# Patient Record
Sex: Female | Born: 1990 | Race: Black or African American | Hispanic: No | Marital: Married | Smoking: Never smoker
Health system: Southern US, Community
[De-identification: ages and names within clinical notes are randomized; demographics above are authoritative.]

## PROBLEM LIST (undated history)

## (undated) ENCOUNTER — Inpatient Hospital Stay (HOSPITAL_COMMUNITY): Payer: Self-pay

## (undated) DIAGNOSIS — Z789 Other specified health status: Secondary | ICD-10-CM

## (undated) DIAGNOSIS — D649 Anemia, unspecified: Secondary | ICD-10-CM

## (undated) HISTORY — PX: NO PAST SURGERIES: SHX2092

---
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
## 2018-07-31 NOTE — L&D Delivery Note (Addendum)
OB/GYN Faculty Practice Delivery Note  Sierra Thomas is a 28 y.o. G1P0 s/p spontaneous vaginal at [redacted]w[redacted]d. She was admitted for Contractions .   GBS Status: Negative (05/11 0000) Maximum Maternal Temperature: Temp (24hrs), Avg:98.2 F (36.8 C), Min:97.8 F (36.6 C), Max:98.6 F (37 C)  Labor Progress: . Admitted in early labor . Started pitocin for slow progress . SROM 8h 34m prior to delivery with mec stained fluid  . Peanut ball placed . Continued pitocin until complete dilation achieved.   Delivery Date/Time: 01/10/2019 at 2137 Delivery: Called to room and patient was complete and pushing. Head delivered OA;LOA. No nuchal cord present. Shoulder and body delivered in usual fashion. Infant with spontaneous cry, placed skin to skin on mother's abdomen, dried and stimulated. Cord clamped x 2 after 1-minute delay, and cut by Dr. Maudie Mercury. Cord blood drawn. Placenta delivered spontaneously with gentle cord traction. Fundus firm with massage and Pitocin. Labia, perineum, vagina, and cervix inspected with 1st degree labial and vaginal with good approximation No repair necessary.   Placenta: spontaneous , intact  Complications: none Lacerations: n/a EBL: 139 mL  Analgesia: IV medication  Postpartum Planning Mom and baby to mother/baby . Lactation consult . AM CBC . Contraception undecided  . No circ  Infant: Viable female  APGAR: 6/8  3226 g  Sierra Thomas, M.D.  01/10/2019 10:01 PM

## 2018-09-23 LAB — OB RESULTS CONSOLE HEPATITIS B SURFACE ANTIGEN: Hepatitis B Surface Ag: NEGATIVE

## 2018-09-23 LAB — OB RESULTS CONSOLE GC/CHLAMYDIA
Chlamydia: NEGATIVE
Gonorrhea: NEGATIVE

## 2018-09-23 LAB — OB RESULTS CONSOLE HIV ANTIBODY (ROUTINE TESTING): HIV: NONREACTIVE

## 2018-09-23 LAB — OB RESULTS CONSOLE RPR: RPR: NONREACTIVE

## 2018-09-23 LAB — OB RESULTS CONSOLE RUBELLA ANTIBODY, IGM: Rubella: IMMUNE

## 2018-10-28 ENCOUNTER — Inpatient Hospital Stay (HOSPITAL_COMMUNITY): Admission: AD | Admit: 2018-10-28 | Payer: Medicaid Other | Source: Home / Self Care

## 2018-12-09 LAB — OB RESULTS CONSOLE GBS: GBS: NEGATIVE

## 2018-12-09 LAB — OB RESULTS CONSOLE GC/CHLAMYDIA
Chlamydia: NEGATIVE
Gonorrhea: NEGATIVE

## 2019-01-10 ENCOUNTER — Encounter (HOSPITAL_COMMUNITY): Payer: Self-pay

## 2019-01-10 ENCOUNTER — Ambulatory Visit (HOSPITAL_COMMUNITY): Payer: Medicaid Other

## 2019-01-10 ENCOUNTER — Other Ambulatory Visit: Payer: Self-pay

## 2019-01-10 ENCOUNTER — Inpatient Hospital Stay (HOSPITAL_COMMUNITY)
Admission: AD | Admit: 2019-01-10 | Discharge: 2019-01-12 | DRG: 807 | Disposition: A | Discharge status: Home / Self Care | Payer: Medicaid Other | Attending: Obstetrics & Gynecology | Admitting: Obstetrics & Gynecology

## 2019-01-10 DIAGNOSIS — Z3A40 40 weeks gestation of pregnancy: Secondary | ICD-10-CM | POA: Diagnosis not present

## 2019-01-10 DIAGNOSIS — Z1159 Encounter for screening for other viral diseases: Secondary | ICD-10-CM

## 2019-01-10 DIAGNOSIS — O480 Post-term pregnancy: Secondary | ICD-10-CM | POA: Diagnosis present

## 2019-01-10 DIAGNOSIS — D649 Anemia, unspecified: Secondary | ICD-10-CM | POA: Diagnosis present

## 2019-01-10 DIAGNOSIS — Z3490 Encounter for supervision of normal pregnancy, unspecified, unspecified trimester: Secondary | ICD-10-CM

## 2019-01-10 DIAGNOSIS — O9902 Anemia complicating childbirth: Secondary | ICD-10-CM | POA: Diagnosis present

## 2019-01-10 HISTORY — DX: Other specified health status: Z78.9

## 2019-01-10 HISTORY — DX: Anemia, unspecified: D64.9

## 2019-01-10 LAB — CBC
HCT: 35.6 % — ABNORMAL LOW (ref 36.0–46.0)
Hemoglobin: 11.8 g/dL — ABNORMAL LOW (ref 12.0–15.0)
MCH: 28.4 pg (ref 26.0–34.0)
MCHC: 33.1 g/dL (ref 30.0–36.0)
MCV: 85.8 fL (ref 80.0–100.0)
Platelets: 220 10*3/uL (ref 150–400)
RBC: 4.15 MIL/uL (ref 3.87–5.11)
RDW: 16.1 % — ABNORMAL HIGH (ref 11.5–15.5)
WBC: 6.9 10*3/uL (ref 4.0–10.5)
nRBC: 0 % (ref 0.0–0.2)

## 2019-01-10 LAB — RPR: RPR Ser Ql: NONREACTIVE

## 2019-01-10 LAB — SARS CORONAVIRUS 2: SARS Coronavirus 2: NOT DETECTED

## 2019-01-10 MED ORDER — ONDANSETRON HCL 4 MG/2ML IJ SOLN: 4.0000 mg | Freq: Four times a day (QID) | INTRAMUSCULAR | Status: DC | PRN

## 2019-01-10 MED ORDER — OXYTOCIN 40 UNITS IN NORMAL SALINE INFUSION - SIMPLE MED
1.0000 m[IU]/min | INTRAVENOUS | Status: DC
  Administered 2019-01-10: 2 m[IU]/min via INTRAVENOUS

## 2019-01-10 MED ORDER — ONDANSETRON HCL 4 MG PO TABS: 4.0000 mg | ORAL_TABLET | ORAL | Status: DC | PRN

## 2019-01-10 MED ORDER — COCONUT OIL OIL: 1.0000 "application " | TOPICAL_OIL | Status: DC | PRN

## 2019-01-10 MED ORDER — ONDANSETRON HCL 4 MG/2ML IJ SOLN: 4.0000 mg | INTRAMUSCULAR | Status: DC | PRN

## 2019-01-10 MED ORDER — TERBUTALINE SULFATE 1 MG/ML IJ SOLN: 0.2500 mg | Freq: Once | INTRAMUSCULAR | Status: DC | PRN

## 2019-01-10 MED ORDER — PRENATAL MULTIVITAMIN CH
1.0000 | ORAL_TABLET | Freq: Every day | ORAL | Status: DC
  Administered 2019-01-11 – 2019-01-12 (×2): 1 via ORAL
  Filled 2019-01-10 (×2): qty 1

## 2019-01-10 MED ORDER — ACETAMINOPHEN 325 MG PO TABS: 650.0000 mg | ORAL_TABLET | ORAL | Status: DC | PRN

## 2019-01-10 MED ORDER — OXYCODONE-ACETAMINOPHEN 5-325 MG PO TABS: 2.0000 | ORAL_TABLET | ORAL | Status: DC | PRN

## 2019-01-10 MED ORDER — ACETAMINOPHEN 325 MG PO TABS
650.0000 mg | ORAL_TABLET | ORAL | Status: DC | PRN
  Administered 2019-01-12: 650 mg via ORAL
  Filled 2019-01-10: qty 2

## 2019-01-10 MED ORDER — DIBUCAINE (PERIANAL) 1 % EX OINT: 1.0000 "application " | TOPICAL_OINTMENT | CUTANEOUS | Status: DC | PRN

## 2019-01-10 MED ORDER — DIPHENHYDRAMINE HCL 25 MG PO CAPS: 25.0000 mg | ORAL_CAPSULE | Freq: Four times a day (QID) | ORAL | Status: DC | PRN

## 2019-01-10 MED ORDER — WITCH HAZEL-GLYCERIN EX PADS: 1.0000 "application " | MEDICATED_PAD | CUTANEOUS | Status: DC | PRN

## 2019-01-10 MED ORDER — FENTANYL CITRATE (PF) 100 MCG/2ML IJ SOLN
50.0000 ug | INTRAMUSCULAR | Status: DC | PRN
  Administered 2019-01-10: 100 ug via INTRAVENOUS
  Filled 2019-01-10: qty 2

## 2019-01-10 MED ORDER — OXYCODONE-ACETAMINOPHEN 5-325 MG PO TABS: 1.0000 | ORAL_TABLET | ORAL | Status: DC | PRN

## 2019-01-10 MED ORDER — OXYTOCIN BOLUS FROM INFUSION
500.0000 mL | Freq: Once | INTRAVENOUS | Status: AC
  Administered 2019-01-10: 22:00:00 500 mL via INTRAVENOUS

## 2019-01-10 MED ORDER — LIDOCAINE HCL (PF) 1 % IJ SOLN: 30.0000 mL | INTRAMUSCULAR | Status: DC | PRN

## 2019-01-10 MED ORDER — IBUPROFEN 600 MG PO TABS
600.0000 mg | ORAL_TABLET | Freq: Four times a day (QID) | ORAL | Status: DC
  Administered 2019-01-10 – 2019-01-12 (×7): 600 mg via ORAL
  Filled 2019-01-10 (×7): qty 1

## 2019-01-10 MED ORDER — MEASLES, MUMPS & RUBELLA VAC IJ SOLR: 0.5000 mL | Freq: Once | INTRAMUSCULAR | Status: DC

## 2019-01-10 MED ORDER — TETANUS-DIPHTH-ACELL PERTUSSIS 5-2.5-18.5 LF-MCG/0.5 IM SUSP: 0.5000 mL | Freq: Once | INTRAMUSCULAR | Status: DC

## 2019-01-10 MED ORDER — SIMETHICONE 80 MG PO CHEW: 80.0000 mg | CHEWABLE_TABLET | ORAL | Status: DC | PRN

## 2019-01-10 MED ORDER — SOD CITRATE-CITRIC ACID 500-334 MG/5ML PO SOLN: 30.0000 mL | ORAL | Status: DC | PRN

## 2019-01-10 MED ORDER — LACTATED RINGERS IV SOLN
500.0000 mL | INTRAVENOUS | Status: DC | PRN
  Administered 2019-01-10: 1000 mL via INTRAVENOUS

## 2019-01-10 MED ORDER — BENZOCAINE-MENTHOL 20-0.5 % EX AERO: 1.0000 "application " | INHALATION_SPRAY | CUTANEOUS | Status: DC | PRN

## 2019-01-10 MED ORDER — OXYTOCIN 40 UNITS IN NORMAL SALINE INFUSION - SIMPLE MED
2.5000 [IU]/h | INTRAVENOUS | Status: DC
  Administered 2019-01-10: 2.5 [IU]/h via INTRAVENOUS
  Filled 2019-01-10: qty 1000

## 2019-01-10 MED ORDER — DOCUSATE SODIUM 100 MG PO CAPS
100.0000 mg | ORAL_CAPSULE | Freq: Two times a day (BID) | ORAL | Status: DC
  Administered 2019-01-10 – 2019-01-12 (×4): 100 mg via ORAL
  Filled 2019-01-10 (×4): qty 1

## 2019-01-10 NOTE — Discharge Summary (Signed)
OB Discharge Summary     Patient Name: Sierra Thomas DOB: 12/17/1990 MRN: [id number]  Date of admission: 01/10/2019 Delivering MD: Genia HotterKIM, RACHEL E   Date of discharge: 01/12/2019  Admitting diagnosis: 43 WKS, ABD AND BACK PAIN Intrauterine pregnancy: 4068w4d     Secondary diagnosis:  Principal Problem:   Intrauterine pregnancy Active Problems:   Anemia  Additional problems: positive antibody screen- S antigen with nonspecific cold antibody     Discharge diagnosis: Term Pregnancy Delivered                                                                                                Post partum procedures:none  Augmentation: Pitocin  Complications: None  Hospital course:  Induction of Labor With Vaginal Delivery   28 y.o. yo G1P0 at 3868w4d was admitted to the hospital 01/10/2019 for induction of labor.  Indication for induction: Postdates.  She was admitted and thought to be in latent labor, however she didn't have cx change and was started on Pitocin for IOL due to 40.4wk status. Her antibody screen was positive on admission labs and was resulted as S antigen w/ nonspecific cold antibody .Patient went on to have an uncomplicated labor course as follows: Membrane Rupture Time/Date: 1:22 PM ,01/10/2019   Intrapartum Procedures: Episiotomy: None [1]                                         Lacerations:  None [1]  Patient had delivery of a Viable infant.  Information for the patient's newborn:  Ardith Darkyiramahirwe, Boy Nazirah [[id number]][[id number]]  Delivery Method: Vag-Spont    01/10/2019  Details of delivery can be found in separate delivery note.  Patient had a routine postpartum course. Patient is discharged home 01/12/19.  Physical exam  Vitals:   01/11/19 0840 01/11/19 1100 01/11/19 2213 01/12/19 0528  BP: (!) 110/57 114/62 103/67 (!) 97/58  Pulse: 71 74 63 80  Resp: 18 18 14 18   Temp: 98.2 F (36.8 C) 98.1 F (36.7 C) 97.9 F (36.6 C) 99.6 F (37.6 C)  TempSrc: Oral Oral Oral  Oral  SpO2: 100% 100% 100% 100%  Weight:      Height:       General: alert, cooperative and no distress Lochia: appropriate Uterine Fundus: firm Incision: N/A DVT Evaluation: No evidence of DVT seen on physical exam. No cords or calf tenderness. No significant calf/ankle edema. Labs: Lab Results  Component Value Date   WBC 9.6 01/11/2019   HGB 10.7 (L) 01/11/2019   HCT 32.7 (L) 01/11/2019   MCV 86.1 01/11/2019   PLT 228 01/11/2019   No flowsheet data found.  Discharge instruction: per After Visit Summary and "Baby and Me Booklet".  After visit meds:  Allergies as of 01/12/2019   No Known Allergies     Medication List    TAKE these medications   ibuprofen 600 MG tablet Commonly known as: ADVIL Take 1 tablet (600 mg total) by mouth every 6 (six) hours.   multivitamin-prenatal  27-0.8 MG Tabs tablet Take 1 tablet by mouth daily at 12 noon.       Diet: routine diet  Activity: Advance as tolerated. Pelvic rest for 6 weeks.   Outpatient follow up:4 weeks Follow up Appt:No future appointments. Follow up Visit:No follow-ups on file.  Postpartum contraception: unsure at time of discharge. Info given. Pelvic rest advised until PP visit and initiation of birth control   Newborn Data: Live born female  Birth Weight: 3226gm (7lb 1.8oz) APGAR: 6, 8  Newborn Delivery   Birth date/time: 01/10/2019 21:37:00 Delivery type: Vaginal, Spontaneous      Baby Feeding: Breast Disposition:home with mother   01/12/2019 Kerry Hough, PA-C

## 2019-01-10 NOTE — Progress Notes (Signed)
OB/GYN Faculty Practice: Labor Progress Note  Subjective: Uncomfortable during contractions, lots of back labor.   Objective: BP (!) 137/57   Pulse 63   Temp 98.4 F (36.9 C) (Oral)   Resp 18   Ht 5\' 1"  (1.549 m)   Wt 73.9 kg   BMI 30.80 kg/m  Gen: uncomfortable appearing.  Dilation: 7 Effacement (%): 100 Cervical Position: Anterior Station: 0 Presentation: Vertex Exam by:: Carter Kitten RNC  Assessment and Plan: 28 y.o. G1P0 [redacted]w[redacted]d admitted in early labor.   Labor: Now in active labor. Per RN exam, more OP presentation at this time. Going to try to peanut ball.  -- pain control: desires unmedicated  -- PPH Risk: low  Fetal Well-Being: EFW 7lbs by Leopolds. Cephalic by sutures.  -- Category I - continuous fetal monitoring - excellent variability, occasional variables but improves with repositioning  -- GBS negative    Ronald Vinsant S. Juleen China, DO OB/GYN Fellow, Faculty Practice  5:35 PM

## 2019-01-10 NOTE — Progress Notes (Addendum)
Patient decided on IV pain medication.  Multiple conversations on education of use of pain medications given over the shift.  Patient has been reluctant to take due to believing that her "baby will have birth defects" from administration.  Both I and Dr Juleen China have counseled her on the way pain medication works.  Answered multiple questions for patient and FOB.

## 2019-01-10 NOTE — H&P (Addendum)
OBSTETRIC ADMISSION HISTORY AND PHYSICAL  Subective Sierra Thomas is a 28 y.o. female G1P0 with IUP at [redacted]w[redacted]d by LMP admitted for active labor.  Reports fetal movement. Denies vaginal bleeding and ROM. She received her prenatal care at [facility].  Support person in labor: unknown  Ultrasounds . 25w3 (09/23/18) - nl . 29w6 (10/21/18) . 36w2 (12/09/18) EFW [id number]   Prenatal History/Complications: . Late [facility]  Past Medical History:  Diagnosis Date  . Anemia   . Medical history non-contributory    Past Surgical History:  Procedure Laterality Date  . NO PAST SURGERIES     OB History    Gravida  1   Para      Term      Preterm      AB      Living        SAB      TAB      Ectopic      Multiple      Live Births             Social History   Socioeconomic History  . Marital status: Married    Spouse name: Not on file  . Number of children: Not on file  . Years of education: Not on file  . Highest education level: Not on file  Occupational History  . Not on file  Social Needs  . Financial resource strain: Not on file  . Food insecurity    Worry: Not on file    Inability: Not on file  . Transportation needs    Medical: Not on file    Non-medical: Not on file  Tobacco Use  . Smoking status: Never Smoker  . Smokeless tobacco: Never Used  Substance and Sexual Activity  . Alcohol use: Never    Frequency: Never  . Drug use: Never  . Sexual activity: Yes    Partners: Male  Lifestyle  . Physical activity    Days per week: Not on file    Minutes per session: Not on file  . Stress: Not on file  Relationships  . Social Herbalist on phone: Not on file    Gets together: Not on file    Attends religious service: Not on file    Active member of club or organization: Not on file    Attends meetings of clubs or organizations: Not on file    Relationship status: Not on file  Other Topics Concern  . Not on file  Social History Narrative   Works  at [employer]. Education: high Printmaker.   Family History: History reviewed. No pertinent family history. Allergies: No Known Allergies Medications:  Current Meds  Medication Sig  . Prenatal Vit-Fe Fumarate-FA (MULTIVITAMIN-PRENATAL) 27-0.8 MG TABS tablet Take 1 tablet by mouth daily at 12 noon.   Review of Systems  All systems reviewed and negative except as stated in HPI  Objective Physical Exam:  Blood pressure 113/77, pulse 75, temperature 98.6 F (37 C), temperature source Oral, resp. rate 15, height 5\' 1"  (1.549 m), weight 73.9 kg. General appearance: alert, appears stated age and mild distress w/ contractions  Lungs: no respiratory distress Heart: RRR. No BLEE.  Abdomen: soft, non-tender; gravid  Pelvic: deferred  Extremities: Moving spontaneously, warm, well perfused. 2+ DP. Uterine activity: contractions q 2-63m Presentation: cephalic Fetal monitoring: baseline 125 / mod variability/ +a / -d  Prenatal labs: ABO, Rh:  O pos Antibody:  Negative Rubella:  immune RPR:  NR HBsAg:   negative HIV:   NR GBS:   negative (12/09/18) Glucola: 112 91 90 Genetic screening:  Too late   Prenatal Transfer Tool  Maternal Diabetes: No Genetic Screening: Declined Maternal Ultrasounds/Referrals: Normal Fetal Ultrasounds or other Referrals:  None Maternal Substance Abuse:  No Significant Maternal Medications:  None Significant Maternal Lab Results: None  Results for orders placed or performed during the hospital encounter of 01/10/19 (from the past 24 hour(s))  CBC   Collection Time: 01/10/19  6:47 AM  Result Value Ref Range   WBC 6.9 4.0 - 10.5 K/uL   RBC 4.15 3.87 - 5.11 MIL/uL   Hemoglobin 11.8 (L) 12.0 - 15.0 g/dL   HCT 40.935.6 (L) 81.136.0 - 91.446.0 %   MCV 85.8 80.0 - 100.0 fL   MCH 28.4 26.0 - 34.0 pg   MCHC 33.1 30.0 - 36.0 g/dL   RDW 78.216.1 (H) 95.611.5 - 21.315.5 %   Platelets 220 150 - 400 K/uL   nRBC 0.0 0.0 - 0.2 %    Patient Active Problem List   Diagnosis Date  Noted  . Intrauterine pregnancy 01/10/2019  . Anemia 01/10/2019    Assessment & Plan:  Sierra Thomas is a 28 y.o. G1P0 at 9434w4d - Spontaneous labor, progressing normally  Labor: Progressing normally  . Pain control: IV pain meds and declines epidural . Anticipated MOD: NSVD . PPH risk: low  Fetal Wellbeing: EFW 2906g at 36w2 U/S. Cephalic by CE.  Category I tracing. . GBS: Negative . Continuous fetal monitoring  Postpartum Planning . boy / breast / undecided  . RI, [x]  TDAP   Genia Hotterachel Kim, M.D.  Family Medicine  PGY-1 01/10/2019 7:46 AM   Attestation: I have seen this patient and agree with the resident's documentation. I have examined them separately, and we have discussed the plan of care.  Cristal DeerLaurel S. Earlene PlaterWallace, DO OB/GYN Fellow

## 2019-01-10 NOTE — Progress Notes (Signed)
OB/GYN Faculty Practice: Labor Progress Note  Subjective: Much more uncomfortable with contractions.   Objective: BP 139/75   Pulse 67   Temp 98.4 F (36.9 C) (Oral)   Resp 15   Ht 5\' 1"  (1.549 m)   Wt 73.9 kg   BMI 30.80 kg/m  Gen: uncomfortable appearing.  Dilation: 4 Effacement (%): 80 Cervical Position: Anterior Station: -2 Presentation: Vertex Exam by:: Dr. Juleen China   Assessment and Plan: 28 y.o. G1P0 [redacted]w[redacted]d admitted in early labor.   Labor: Contractions now back to back, will decrease pitocin to 6. SROM meconium-stained fluid with SVE. Cervix thinning out with minimal change in dilation at this time.  -- pain control: desires unmedicated  -- PPH Risk: low  Fetal Well-Being: EFW 7lbs by Leopolds. Cephalic by sutures.  -- Category I - continuous fetal monitoring - in last couple of hours has had occasional variables but seem to resolve on own or with repositioning -- GBS negative    Charisse Wendell S. Juleen China, DO OB/GYN Fellow, Faculty Practice  1:26 PM

## 2019-01-10 NOTE — Progress Notes (Signed)
DR Maudie Mercury notified of pt's admission and status. Aware of ctx pattern, sve, reactive FHR tracing, late to care. Will admit to [facility]

## 2019-01-10 NOTE — Progress Notes (Signed)
OB/GYN Faculty Practice: Labor Progress Note  *Late entry because of patient care responsibilities. Saw patient around 0930 just prior to pitocin being started* Subjective: Doing well, contractions have spaced out. Somewhat uncomfortable appearing.   Objective: BP (!) 109/59   Pulse 70   Temp 98.6 F (37 C) (Oral)   Resp 16   Ht 5\' 1"  (1.549 m)   Wt 73.9 kg   BMI 30.80 kg/m  Gen: mildly uncomfortable appearing during contractons Dilation: 3.5 Effacement (%): 50 Cervical Position: Anterior Station: -2 Presentation: Vertex Exam by:: Dr Juleen China  Assessment and Plan: 28 y.o. G1P0 [redacted]w[redacted]d here for early/latent labor.   Labor: On check, patient is essentially unchanged (reported to have been 4-5cm dilated) and contractions have spaced out since IVF started. Discussed options including augmenting early labor with pitocin since post-dates, expectant management. Patient agreeable to pitocin. Does have BBOW so once head better engaged will consider AROM.  -- pain control: desires unmedicated  -- PPH Risk: low  Fetal Well-Being: EFW 7lbs by Leopolds. Cephalic by sutures.  -- Category I - continuous fetal monitoring  -- GBS negative    Alvira Hecht S. Juleen China, DO OB/GYN Fellow, Faculty Practice  11:45 AM

## 2019-01-10 NOTE — MAU Note (Signed)
Contractions since 1200 Thurs after clinic appt. Was 3cm then. Having some bloody show.

## 2019-01-10 NOTE — Progress Notes (Signed)
Patient ID: Sierra Thomas, female   DOB: 26-Jun-1991, 28 y.o.   MRN: [id number]  In to room to introduce myself; pt rec'd Fentanyl @ 1830 and was 8/100/0 per RN exam at that time. FHR 120-130s, +accels, occ variables. Ctx reg with Pit at 74mu/min. Support offered.  Myrtis Ser [facility] 01/10/2019 6:51 PM

## 2019-01-11 LAB — CBC
HCT: 32.7 % — ABNORMAL LOW (ref 36.0–46.0)
Hemoglobin: 10.7 g/dL — ABNORMAL LOW (ref 12.0–15.0)
MCH: 28.2 pg (ref 26.0–34.0)
MCHC: 32.7 g/dL (ref 30.0–36.0)
MCV: 86.1 fL (ref 80.0–100.0)
Platelets: 228 10*3/uL (ref 150–400)
RBC: 3.8 MIL/uL — ABNORMAL LOW (ref 3.87–5.11)
RDW: 16.3 % — ABNORMAL HIGH (ref 11.5–15.5)
WBC: 9.6 10*3/uL (ref 4.0–10.5)
nRBC: 0 % (ref 0.0–0.2)

## 2019-01-11 NOTE — Lactation Note (Signed)
This note was copied from a baby's chart. Lactation Consultation Note  Patient Name: Sierra Thomas MCNOB'S Date: 01/11/2019   P1, Baby 17 hours, sleepy and spitty. Reviewed hand expression and spoon fed baby drops. Attempted latching in football hold but baby did not sustain latch.  Left baby STS on mother's chest. Feed on demand approximately 8-12 times per day.   Reviewed basics and provided lactation brochure.      Maternal Data    Feeding    LATCH Score                   Interventions    Lactation Tools Discussed/Used     Consult Status      Vivianne Master [facility] 01/11/2019, 3:02 PM

## 2019-01-11 NOTE — Progress Notes (Addendum)
Post Partum Day 1  Subjective: Up ad lib, voiding, tolerating PO, and + flatus. Denies dizziness, lightheadedness, tachycardia. Appropriate Lochia. Pain well controlled. Reports mild headache this morning, but not worsening.  Objective: BP (!) 106/57 (BP Location: Right Arm)   Pulse 78   Temp 98.9 F (37.2 C) (Oral)   Resp 20   Ht 5\' 1"  (1.549 m)   Wt 73.9 kg   SpO2 99%   Breastfeeding Unknown   BMI 30.80 kg/m  Intake/Output      06/12 0701 - 06/13 0700 06/13 0701 - 06/14 0700   Blood 139    Total Output 139    Net -139           Physical Exam:  General: alert, cooperative, appears stated age, fatigued, and no distress Lungs: CTAB, no crackles or wheezes Heart: RRR, no m/r/r.  Lochia: appropriate Uterine Fundus: firm DVT Evaluation: No evidence of DVT seen on physical exam, extremities warm and well perfused.   Recent Labs    01/10/19 0647 01/11/19 0451  HGB 11.8* 10.7*  HCT 35.6* 32.7*   Assessment/Plan: Postpartum Day # 1 . Plan for d/c tomorrow  . Continue daily lactation  . Contraception: undecided.  . Circumcision: no    LOS: 1 day   Wilber Oliphant 01/11/2019, 7:03 AM

## 2019-01-12 MED ORDER — IBUPROFEN 600 MG PO TABS: 600.0000 mg | ORAL_TABLET | Freq: Four times a day (QID) | ORAL | 0 refills | Status: DC

## 2019-01-12 NOTE — Discharge Instructions (Signed)
Vaginal Delivery, Care After °Refer to this sheet in the next few weeks. These instructions provide you with information about caring for yourself after vaginal delivery. Your health care provider may also give you more specific instructions. Your treatment has been planned according to current medical practices, but problems sometimes occur. Call your health care provider if you have any problems or questions. °What can I expect after the procedure? °After vaginal delivery, it is common to have: °· Some bleeding from your vagina. °· Soreness in your abdomen, your vagina, and the area of skin between your vaginal opening and your anus (perineum). °· Pelvic cramps. °· Fatigue. °Follow these instructions at home: °Medicines °· Take over-the-counter and prescription medicines only as told by your health care provider. °· If you were prescribed an antibiotic medicine, take it as told by your health care provider. Do not stop taking the antibiotic until it is finished. °Driving ° °· Do not drive or operate heavy machinery while taking prescription pain medicine. °· Do not drive for 24 hours if you received a sedative. °Lifestyle °· Do not drink alcohol. This is especially important if you are breastfeeding or taking medicine to relieve pain. °· Do not use tobacco products, including cigarettes, chewing tobacco, or e-cigarettes. If you need help quitting, ask your health care provider. °Eating and drinking °· Drink at least 8 eight-ounce glasses of water every day unless you are told not to by your health care provider. If you choose to breastfeed your baby, you may need to drink more water than this. °· Eat high-fiber foods every day. These foods may help prevent or relieve constipation. High-fiber foods include: °? Whole grain cereals and breads. °? Brown rice. °? Beans. °? Fresh fruits and vegetables. °Activity °· Return to your normal activities as told by your health care provider. Ask your health care provider what  activities are safe for you. °· Rest as much as possible. Try to rest or take a nap when your baby is sleeping. °· Do not lift anything that is heavier than your baby or 10 lb (4.5 kg) until your health care provider says that it is safe. °· Talk with your health care provider about when you can engage in sexual activity. This may depend on your: °? Risk of infection. °? Rate of healing. °? Comfort and desire to engage in sexual activity. °Vaginal Care °· If you have an episiotomy or a vaginal tear, check the area every day for signs of infection. Check for: °? More redness, swelling, or pain. °? More fluid or blood. °? Warmth. °? Pus or a bad smell. °· Do not use tampons or douches until your health care provider says this is safe. °· Watch for any blood clots that may pass from your vagina. These may look like clumps of dark red, brown, or black discharge. °General instructions °· Keep your perineum clean and dry as told by your health care provider. °· Wear loose, comfortable clothing. °· Wipe from front to back when you use the toilet. °· Ask your health care provider if you can shower or take a bath. If you had an episiotomy or a perineal tear during labor and delivery, your health care provider may tell you not to take baths for a certain length of time. °· Wear a bra that supports your breasts and fits you well. °· If possible, have someone help you with household activities and help care for your baby for at least a few days after you   leave the hospital.  Keep all follow-up visits for you and your baby as told by your health care provider. This is important. Contact a health care provider if:  You have: ? Vaginal discharge that has a bad smell. ? Difficulty urinating. ? Pain when urinating. ? A sudden increase or decrease in the frequency of your bowel movements. ? More redness, swelling, or pain around your episiotomy or vaginal tear. ? More fluid or blood coming from your episiotomy or vaginal  tear. ? Pus or a bad smell coming from your episiotomy or vaginal tear. ? A fever. ? A rash. ? Little or no interest in activities you used to enjoy. ? Questions about caring for yourself or your baby.  Your episiotomy or vaginal tear feels warm to the touch.  Your episiotomy or vaginal tear is separating or does not appear to be healing.  Your breasts are painful, hard, or turn red.  You feel unusually sad or worried.  You feel nauseous or you vomit.  You pass large blood clots from your vagina. If you pass a blood clot from your vagina, save it to show to your health care provider. Do not flush blood clots down the toilet without having your health care provider look at them.  You urinate more than usual.  You are dizzy or light-headed.  You have not breastfed at all and you have not had a menstrual period for 12 weeks after delivery.  You have stopped breastfeeding and you have not had a menstrual period for 12 weeks after you stopped breastfeeding. Get help right away if:  You have: ? Pain that does not go away or does not get better with medicine. ? Chest pain. ? Difficulty breathing. ? Blurred vision or spots in your vision. ? Thoughts about hurting yourself or your baby.  You develop pain in your abdomen or in one of your legs.  You develop a severe headache.  You faint.  You bleed from your vagina so much that you fill two sanitary pads in one hour. This information is not intended to replace advice given to you by your health care provider. Make sure you discuss any questions you have with your health care provider. Document Released: 07/14/2000 Document Revised: 12/29/2015 Document Reviewed: 08/01/2015 Elsevier Interactive Patient Education  2019 [employer].  Contraception Choices Contraception, also called birth control, means things to use or ways to try not to get pregnant. Hormonal birth control This kind of birth control uses hormones. Here are some  types of hormonal birth control:  A tube that is put under skin of the arm (implant). The tube can stay in for as long as 3 years.  Shots to get every 3 months (injections).  Pills to take every day (birth control pills).  A patch to change 1 time each week for 3 weeks (birth control patch). After that, the patch is taken off for 1 week.  A ring to put in the vagina. The ring is left in for 3 weeks. Then it is taken out of the vagina for 1 week. Then a new ring is put in.  Pills to take after unprotected sex (emergency birth control pills). Barrier birth control Here are some types of barrier birth control:  A thin covering that is put on the penis before sex (female condom). The covering is thrown away after sex.  A soft, loose covering that is put in the vagina before sex (female condom). The covering is thrown away after  sex.  A rubber bowl that sits over the cervix (diaphragm). The bowl must be made for you. The bowl is put into the vagina before sex. The bowl is left in for 6-8 hours after sex. It is taken out within 24 hours.  A small, soft cup that fits over the cervix (cervical cap). The cup must be made for you. The cup can be left in for 6-8 hours after sex. It is taken out within 48 hours.  A sponge that is put into the vagina before sex. It must be left in for at least 6 hours after sex. It must be taken out within 30 hours. Then it is thrown away.  A chemical that kills or stops sperm from getting into the uterus (spermicide). It may be a pill, cream, jelly, or foam to put in the vagina. The chemical should be used at least 10-15 minutes before sex. IUD (intrauterine) birth control An IUD is a small, T-shaped piece of plastic. It is put inside the uterus. There are two kinds:  Hormone IUD. This kind can stay in for 3-5 years.  Copper IUD. This kind can stay in for 10 years. Permanent birth control Here are some types of permanent birth control:  Surgery to block the  fallopian tubes.  Having an insert put into each fallopian tube.  Surgery to tie off the tubes that carry sperm (vasectomy). Natural planning birth control Here are some types of natural planning birth control:  Not having sex on the days the woman could get pregnant.  Using a calendar: ? To keep track of the length of each period. ? To find out what days pregnancy can happen. ? To plan to not have sex on days when pregnancy can happen.  Watching for symptoms of ovulation and not having sex during ovulation. One way the woman can check for ovulation is to check her temperature.  Waiting to have sex until after ovulation. Summary  Contraception, also called birth control, means things to use or ways to try not to get pregnant.  Hormonal methods of birth control include implants, injections, pills, patches, vaginal rings, and emergency birth control pills.  Barrier methods of birth control can include female condoms, female condoms, diaphragms, cervical caps, sponges, and spermicides.  There are two types of IUD (intrauterine device) birth control. An IUD can be put in a woman's uterus to prevent pregnancy for 3-5 years.  Permanent sterilization can be done through a procedure for males, females, or both.  Natural planning methods involve not having sex on the days when the woman could get pregnant. This information is not intended to replace advice given to you by your health care provider. Make sure you discuss any questions you have with your health care provider. Document Released: 05/14/2009 Document Revised: 02/21/2018 Document Reviewed: 07/27/2016 Elsevier Interactive Patient Education  2019 [employer]

## 2019-01-12 NOTE — Lactation Note (Signed)
This note was copied from a baby's chart. Lactation Consultation Note  Patient Name: Sierra Thomas [id number] Date: 01/12/2019 Reason for consult: Follow-up assessment;Term;Infant weight loss([place] interpreter - # [phone] - Tammi Klippel) [name] is 4 hours old  Teachey reviewed and updated the doc flow sheets with mom  / and per interpreter.  Per mom breast feeding and latching are going well and no soreness.  Sore nipple and engorgement prevention and tx reviewed.  LC reviewed the use of hand pump, storage of breast milk, importance of STS feedings Until the baby is back to birth weight and gaining steadily, and can stay awake for majority of feeding.  Discussed nutritive feeding patterns compared to non - nutritive , and the importance of watching the  Baby for hanging out latched.  LC recommended to mom to keep feeding diary for at least 2 weeks.  Mom mentioned she has an [place] speaking friend that can call with her breast feeding questions.  Mom as the phone number and website.  Per mom - her breast feeding questions were answered and she was satisfied.   Maternal Data Has patient been taught Hand Expression?: Yes  Feeding Feeding Type: (just finished breast feeding - deep latch noted)  LATCH Score                   Interventions Interventions: Breast feeding basics reviewed  Lactation Tools Discussed/Used Tools: Pump;Flanges Flange Size: 24 Breast pump type: Manual Pump Review: Setup, frequency, and cleaning;Milk Storage Initiated by:: MAI Date initiated:: 01/12/19   Consult Status Consult Status: Complete Date: 01/12/19    [name] 01/12/2019, 12:05 PM

## 2019-01-13 ENCOUNTER — Inpatient Hospital Stay (HOSPITAL_COMMUNITY): Payer: Medicaid Other

## 2019-01-21 LAB — TYPE AND SCREEN
ABO/RH(D): O POS
Antibody Identification: POSITIVE
Antibody Screen: POSITIVE
Donor AG Type: NEGATIVE
Donor AG Type: NEGATIVE
Unit division: 0
Unit division: 0

## 2019-01-21 LAB — BPAM RBC
Blood Product Expiration Date: 202007172359
Blood Product Expiration Date: 202007172359
ISSUE DATE / TIME: 202006211820
ISSUE DATE / TIME: 202006220015
Unit Type and Rh: 5100
Unit Type and Rh: 5100

## 2019-11-11 ENCOUNTER — Other Ambulatory Visit: Payer: Self-pay

## 2019-11-11 ENCOUNTER — Encounter (HOSPITAL_COMMUNITY): Payer: Self-pay

## 2019-11-11 ENCOUNTER — Ambulatory Visit (HOSPITAL_COMMUNITY)
Admission: EM | Admit: 2019-11-11 | Discharge: 2019-11-11 | Disposition: A | Discharge status: Home / Self Care | Payer: Medicaid Other | Attending: Family Medicine | Admitting: Family Medicine

## 2019-11-11 DIAGNOSIS — M545 Low back pain, unspecified: Secondary | ICD-10-CM

## 2019-11-11 DIAGNOSIS — M546 Pain in thoracic spine: Secondary | ICD-10-CM

## 2019-11-11 MED ORDER — IBUPROFEN 800 MG PO TABS: 800.0000 mg | ORAL_TABLET | Freq: Three times a day (TID) | ORAL | 0 refills | Status: DC

## 2019-11-11 NOTE — Discharge Instructions (Signed)
Use anti-inflammatories for pain/swelling. You may take up to 800 mg Ibuprofen every 8 hours with food. You may supplement Ibuprofen with Tylenol [phone] mg every 8 hours.  Gentle stretching- see attached Alternate ice and heat Avoid heavy lifting, use legs to lift/bend down Follow up if not improving or worsening

## 2019-11-11 NOTE — ED Triage Notes (Signed)
Pt c/o lower back pain for approx 1 week that increases with turning; denies injury/trauma to area. Denies abdom pain, or dysuria sx.

## 2019-11-12 NOTE — ED Provider Notes (Signed)
[facility]    CSN: [id number] Arrival date & time: 11/11/19  1659      History   Chief Complaint Chief Complaint  Patient presents with  . Back Pain    HPI Amber Williard is a 29 y.o. female presenting today for evaluation of back pain.  Patient notes that she has had mid and lower back pain for approximately 1 week.  She denies any injury trauma or fall.  She denies any increase in activity or heavy lifting.  Pain worsens with twisting.  Denies abdominal pain nausea or vomiting.  Denies urinary symptoms of dysuria, increased frequency or urgency.  Patient does have a 92-month-old.  Is breast-feeding.  HPI  Past Medical History:  Diagnosis Date  . Anemia   . Medical history non-contributory     Patient Active Problem List   Diagnosis Date Noted  . Intrauterine pregnancy 01/10/2019  . Anemia 01/10/2019    Past Surgical History:  Procedure Laterality Date  . NO PAST SURGERIES      OB History    Gravida  1   Para  1   Term  1   Preterm      AB      Living  1     SAB      TAB      Ectopic      Multiple  0   Live Births  1            Home Medications    Prior to Admission medications   Medication Sig Start Date End Date Taking? Authorizing Provider  ibuprofen (ADVIL) 800 MG tablet Take 1 tablet (800 mg total) by mouth 3 (three) times daily. 11/11/19   Takiera Mayo C, PA-C  Prenatal Vit-Fe Fumarate-FA (MULTIVITAMIN-PRENATAL) 27-0.8 MG TABS tablet Take 1 tablet by mouth daily at 12 noon.    [provider]    Family History Family History  Problem Relation Age of Onset  . Healthy Mother   . Healthy Father     Social History Social History   Tobacco Use  . Smoking status: Never Smoker  . Smokeless tobacco: Never Used  Substance Use Topics  . Alcohol use: Never  . Drug use: Never     Allergies   Patient has no known allergies.   Review of Systems Review of Systems  Constitutional: Negative for  fatigue and fever.  Eyes: Negative for visual disturbance.  Respiratory: Negative for shortness of breath.   Cardiovascular: Negative for chest pain.  Gastrointestinal: Negative for abdominal pain, nausea and vomiting.  Musculoskeletal: Positive for back pain and myalgias. Negative for arthralgias and joint swelling.  Skin: Negative for color change, rash and wound.  Neurological: Negative for dizziness, weakness, light-headedness and headaches.     Physical Exam Triage Vital Signs ED Triage Vitals  Enc Vitals Group     BP 11/11/19 1736 131/86     Pulse Rate 11/11/19 1736 68     Resp 11/11/19 1736 16     Temp 11/11/19 1736 97.8 F (36.6 C)     Temp Source 11/11/19 1736 Oral     SpO2 11/11/19 1736 98 %     Weight --      Height --      Head Circumference --      Peak Flow --      Pain Score 11/11/19 1748 8     Pain Loc --      Pain Edu? --  Excl. in GC? --    No data found.  Updated Vital Signs BP 131/86 (BP Location: Left Arm)   Pulse 68   Temp 97.8 F (36.6 C) (Oral)   Resp 16   LMP 10/25/2019 (Approximate)   SpO2 98%   Visual Acuity Right Eye Distance:   Left Eye Distance:   Bilateral Distance:    Right Eye Near:   Left Eye Near:    Bilateral Near:     Physical Exam Vitals and nursing note reviewed.  Constitutional:      Appearance: She is well-developed.     Comments: No acute distress  HENT:     Head: Normocephalic and atraumatic.     Nose: Nose normal.  Eyes:     Conjunctiva/sclera: Conjunctivae normal.  Cardiovascular:     Rate and Rhythm: Normal rate.  Pulmonary:     Effort: Pulmonary effort is normal. No respiratory distress.     Comments: Breathing comfortably at rest, CTABL, no wheezing, rales or other adventitious sounds auscultated  Abdominal:     General: There is no distension.  Musculoskeletal:        General: Normal range of motion.     Cervical back: Neck supple.     Comments: Mildly tender to palpation to middle thoracic  spine midline, mildly tender to superior lumbar area midline, increased tenderness to bilateral thoracic musculature and lumbar musculature  Moving all extremities appropriately, gait without abnormality  Skin:    General: Skin is warm and dry.  Neurological:     Mental Status: She is alert and oriented to person, place, and time.      UC Treatments / Results  Labs (all labs ordered are listed, but only abnormal results are displayed) Labs Reviewed - No data to display  EKG   Radiology No results found.  Procedures Procedures (including critical care time)  Medications Ordered in UC Medications - No data to display  Initial Impression / Assessment and Plan / UC Course  I have reviewed the triage vital signs and the nursing notes.  Pertinent labs & imaging results that were available during my care of the patient were reviewed by me and considered in my medical decision making (see chart for details).     No mechanism of injury, tenderness diffusely and laterally, most likely muscular etiology.  Treating as thoracic and lumbar straining, likely from frequent lifting and carrying of child.  Recommending anti-inflammatories, Tylenol and ibuprofen.  Will defer muscle relaxers as patient is breast-feeding.  Gentle stretching.  Monitor for gradual improvement.  Discussed proper lifting mechanics.  Discussed strict return precautions. Patient verbalized understanding and is agreeable with plan.  Final Clinical Impressions(s) / UC Diagnoses   Final diagnoses:  Acute bilateral thoracic back pain  Acute bilateral low back pain without sciatica     Discharge Instructions     Use anti-inflammatories for pain/swelling. You may take up to 800 mg Ibuprofen every 8 hours with food. You may supplement Ibuprofen with Tylenol [phone] mg every 8 hours.  Gentle stretching- see attached Alternate ice and heat Avoid heavy lifting, use legs to lift/bend down Follow up if not improving  or worsening   ED Prescriptions    Medication Sig Dispense Auth. Provider   ibuprofen (ADVIL) 800 MG tablet Take 1 tablet (800 mg total) by mouth 3 (three) times daily. 21 tablet Teondra [place], [place] C, PA-C     PDMP not reviewed this encounter.   Janith Lima, [place] 11/12/19 [phone]

## 2020-04-23 ENCOUNTER — Other Ambulatory Visit: Payer: Self-pay

## 2020-04-23 ENCOUNTER — Encounter (HOSPITAL_COMMUNITY): Payer: Self-pay

## 2020-04-23 ENCOUNTER — Ambulatory Visit (HOSPITAL_COMMUNITY)
Admission: EM | Admit: 2020-04-23 | Discharge: 2020-04-23 | Disposition: A | Discharge status: Home / Self Care | Payer: Medicaid Other | Attending: Physician Assistant | Admitting: Physician Assistant

## 2020-04-23 DIAGNOSIS — Z20822 Contact with and (suspected) exposure to covid-19: Secondary | ICD-10-CM | POA: Insufficient documentation

## 2020-04-23 DIAGNOSIS — Z3201 Encounter for pregnancy test, result positive: Secondary | ICD-10-CM | POA: Diagnosis not present

## 2020-04-23 DIAGNOSIS — R109 Unspecified abdominal pain: Secondary | ICD-10-CM

## 2020-04-23 DIAGNOSIS — R197 Diarrhea, unspecified: Secondary | ICD-10-CM

## 2020-04-23 LAB — POC URINE PREG, ED: Preg Test, Ur: POSITIVE — AB

## 2020-04-23 LAB — POCT URINALYSIS DIPSTICK, ED / UC
Bilirubin Urine: NEGATIVE
Glucose, UA: NEGATIVE mg/dL
Hgb urine dipstick: NEGATIVE
Leukocytes,Ua: NEGATIVE
Nitrite: NEGATIVE
Protein, ur: NEGATIVE mg/dL
Specific Gravity, Urine: 1.02 (ref 1.005–1.030)
Urobilinogen, UA: 0.2 mg/dL (ref 0.0–1.0)
pH: 8.5 — ABNORMAL HIGH (ref 5.0–8.0)

## 2020-04-23 NOTE — ED Provider Notes (Signed)
[facility]    CSN: [id number] Arrival date & time: 04/23/20  1747      History   Chief Complaint Chief Complaint  Patient presents with  . Abdominal Pain    HPI Sierra Thomas is a 29 y.o. female.   Pt reports she has diarrhea.  Pt thinks she may be pregnant.  Pt reports she has had diarrhea for 1 day.  Pt is tolerating oral fluids, no vomiting.  No abdominal pain  The history is provided by the patient. No language interpreter was used.  Diarrhea Quality:  Watery Severity:  Moderate Number of episodes:  Multiple Duration:  1 day Timing:  Intermittent Progression:  Worsening Relieved by:  Nothing Associated symptoms: no abdominal pain   Risk factors: no sick contacts     Past Medical History:  Diagnosis Date  . Anemia   . Medical history non-contributory     Patient Active Problem List   Diagnosis Date Noted  . Intrauterine pregnancy 01/10/2019  . Anemia 01/10/2019    Past Surgical History:  Procedure Laterality Date  . NO PAST SURGERIES      OB History    Gravida  1   Para  1   Term  1   Preterm      AB      Living  1     SAB      TAB      Ectopic      Multiple  0   Live Births  1            Home Medications    Prior to Admission medications   Medication Sig Start Date End Date Taking? Authorizing Provider  ibuprofen (ADVIL) 800 MG tablet Take 1 tablet (800 mg total) by mouth 3 (three) times daily. 11/11/19   Wieters, Hallie C, PA-C  Prenatal Vit-Fe Fumarate-FA (MULTIVITAMIN-PRENATAL) 27-0.8 MG TABS tablet Take 1 tablet by mouth daily at 12 noon.    [provider]    Family History Family History  Problem Relation Age of Onset  . Healthy Mother   . Healthy Father     Social History Social History   Tobacco Use  . Smoking status: Never Smoker  . Smokeless tobacco: Never Used  Vaping Use  . Vaping Use: Never used  Substance Use Topics  . Alcohol use: Never  . Drug use: Never      Allergies   Patient has no known allergies.   Review of Systems Review of Systems  Gastrointestinal: Positive for diarrhea. Negative for abdominal pain.  All other systems reviewed and are negative.    Physical Exam Triage Vital Signs ED Triage Vitals  Enc Vitals Group     BP 04/23/20 1943 (!) 104/56     Pulse Rate 04/23/20 1943 63     Resp 04/23/20 1943 16     Temp 04/23/20 1943 98.7 F (37.1 C)     Temp Source 04/23/20 1943 Oral     SpO2 04/23/20 1943 100 %     Weight --      Height --      Head Circumference --      Peak Flow --      Pain Score 04/23/20 1944 8     Pain Loc --      Pain Edu? --      Excl. in GC? --    No data found.  Updated Vital Signs BP (!) 104/56 (BP Location: Left Arm)  Pulse 63   Temp 98.7 F (37.1 C) (Oral)   Resp 16   SpO2 100%   Visual Acuity Right Eye Distance:   Left Eye Distance:   Bilateral Distance:    Right Eye Near:   Left Eye Near:    Bilateral Near:     Physical Exam Vitals and nursing note reviewed.  Constitutional:      Appearance: She is well-developed.  HENT:     Head: Normocephalic.  Eyes:     Extraocular Movements: Extraocular movements intact.  Cardiovascular:     Rate and Rhythm: Normal rate.  Pulmonary:     Effort: Pulmonary effort is normal.  Abdominal:     General: Abdomen is flat. Bowel sounds are normal. There is no distension.     Palpations: Abdomen is soft.  Musculoskeletal:        General: Normal range of motion.     Cervical back: Normal range of motion.  Skin:    General: Skin is warm.  Neurological:     Mental Status: She is alert and oriented to person, place, and time.      UC Treatments / Results  Labs (all labs ordered are listed, but only abnormal results are displayed) Labs Reviewed  POCT URINALYSIS DIPSTICK, ED / UC - Abnormal; Notable for the following components:      Result Value   Ketones, ur TRACE (*)    pH 8.5 (*)    All other components within normal  limits  POC URINE PREG, ED - Abnormal; Notable for the following components:   Preg Test, Ur POSITIVE (*)    All other components within normal limits  SARS CORONAVIRUS 2 (TAT 6-24 HRS)    EKG   Radiology No results found.  Procedures Procedures (including critical care time)  Medications Ordered in UC Medications - No data to display  Initial Impression / Assessment and Plan / UC Course  I have reviewed the triage vital signs and the nursing notes.  Pertinent labs & imaging results that were available during my care of the patient were reviewed by me and considered in my medical decision making (see chart for details).     MDM:  Pregnancy test is positive.  Pt advised to follow up with ob/  [facility] if symptoms persit  Final Clinical Impressions(s) / UC Diagnoses   Final diagnoses:  Diarrhea, unspecified type   Discharge Instructions   None    ED Prescriptions    None     PDMP not reviewed this encounter.  An After Visit Summary was printed and given to the patient.    Elson Areas, [place] 04/23/20 2033

## 2020-04-23 NOTE — ED Triage Notes (Signed)
Pt presents today with generalized abdominal pain that started yesterday. States every time she eats it goes straight through her as diarrhea. No nausea or vomiting or other sxs.

## 2020-04-24 LAB — SARS CORONAVIRUS 2 (TAT 6-24 HRS): SARS Coronavirus 2: NEGATIVE

## 2020-05-25 ENCOUNTER — Encounter (HOSPITAL_COMMUNITY): Payer: Self-pay | Admitting: Obstetrics & Gynecology

## 2020-05-25 ENCOUNTER — Other Ambulatory Visit: Payer: Self-pay

## 2020-05-25 ENCOUNTER — Inpatient Hospital Stay (HOSPITAL_COMMUNITY)
Admission: AD | Admit: 2020-05-25 | Discharge: 2020-05-25 | Disposition: A | Discharge status: Home / Self Care | Payer: Medicaid Other | Attending: Obstetrics & Gynecology | Admitting: Obstetrics & Gynecology

## 2020-05-25 DIAGNOSIS — Z3201 Encounter for pregnancy test, result positive: Secondary | ICD-10-CM | POA: Diagnosis present

## 2020-05-25 MED ORDER — PREPLUS 27-1 MG PO TABS: 1.0000 | ORAL_TABLET | Freq: Every day | ORAL | 13 refills | Status: DC

## 2020-05-25 NOTE — Discharge Instructions (Signed)
Prenatal Care Providers           [facility]  [address] [phone]  [facility]   [address]  [phone]  [facility] [place]       [address] [phone]            [facility] [place]     [address] [phone]          [facility] [place]   [address] [phone]  [facility]  [address] [phone]     [facility])  [address]   [phone]     [facility]  Phone: [phone]  [facility]  Phone: [phone]  [facility] Phone: [phone]  [facility] Phone: [phone]  [facility] Phone: [phone]  [facility] Phone: [phone]  [facility]  Phone: [phone] Safe Medications in Pregnancy   Acne: Benzoyl Peroxide Salicylic Acid  Backache/Headache: Tylenol: 2 regular strength every 4 hours OR              2 Extra strength every 6 hours  Colds/Coughs/Allergies: Benadryl (alcohol free) 25 mg every 6 hours as needed Breath right strips Claritin Cepacol throat lozenges Chloraseptic throat spray Cold-Eeze- up to three times per day Cough drops, alcohol free Flonase (by prescription only) Guaifenesin Mucinex Robitussin DM (plain only, alcohol free) Saline nasal spray/drops Sudafed (pseudoephedrine) & Actifed ** use only after [redacted] weeks gestation and if you do not have high blood pressure Tylenol Vicks Vaporub Zinc lozenges Zyrtec   Constipation: Colace Ducolax suppositories Fleet enema Glycerin suppositories Metamucil Milk of magnesia Miralax Senokot Smooth move tea  Diarrhea: Kaopectate Imodium A-D  *NO pepto  Bismol  Hemorrhoids: Anusol Anusol HC Preparation H Tucks  Indigestion: Tums Maalox Mylanta Zantac  Pepcid  Insomnia: Benadryl (alcohol free) 25mg every 6 hours as needed Tylenol PM Unisom, no Gelcaps  Leg Cramps: Tums MagGel  Nausea/Vomiting:  Bonine Dramamine Emetrol Ginger extract Sea bands Meclizine  Nausea medication to take during pregnancy:  Unisom (doxylamine succinate 25 mg tablets) Take one tablet daily at bedtime. If symptoms are not adequately controlled, the dose can be increased to a maximum recommended dose of two tablets daily (1/2 tablet in the morning, 1/2 tablet mid-afternoon and one at bedtime). Vitamin B6 100mg tablets. Take one tablet twice a day (up to 200 mg per day).  Skin Rashes: Aveeno products Benadryl cream or 25mg every 6 hours as needed Calamine Lotion 1% cortisone cream  Yeast infection: Gyne-lotrimin 7 Monistat 7   **If taking multiple medications, please check labels to avoid duplicating the same active ingredients **take medication as directed on the label ** Do not exceed 4000 mg of tylenol in 24 hours **Do not take medications that contain aspirin or ibuprofen    

## 2020-05-25 NOTE — MAU Note (Signed)
Pt given Rx for Prenatal vitamins and list of OB offices and safe meds in pregnancy.

## 2020-05-25 NOTE — MAU Provider Note (Signed)
First Provider Initiated Contact with Patient 05/25/20 1302     [name] is a 29 y.o. G2P1001 patient who presents to MAU today to present for prenatal care. She states Urgent Care instructed her that there was a walk-in clinic at [facility] where she could receive care. MAU Registration recorded her chief complaint as "Ab pain" but, with help of Swahili interpreter, patient denies all pregnancy concerns to both MAU Charge RN and CNM.  She denies abdominal pain, vaginal bleeding, dysuria, fever or recent illness.  O LMP 10/25/2019 (Approximate)    Physical Exam Vitals and nursing note reviewed. Exam conducted with a chaperone present.  HENT:     Mouth/Throat:     Mouth: Mucous membranes are moist.  Cardiovascular:     Rate and Rhythm: Normal rate.     Pulses: Normal pulses.  Pulmonary:     Effort: Pulmonary effort is normal.  Skin:    Capillary Refill: Capillary refill takes less than 2 seconds.  Neurological:     General: No focal deficit present.     Mental Status: She is alert.  Psychiatric:        Mood and Affect: Mood normal.        Behavior: Behavior normal.        Thought Content: Thought content normal.        Judgment: Judgment normal.    A Medical screening exam complete Pregnancy confirmed at [facility] on 04/23/2020 Language barrier: remote Swahili interpreter utilized for all patient interaction  P Discharge from MAU in stable condition Given lists of prenatal care Providers in [place], safe medications in pregnancy Return to MAU for pregnancy-related emergencies PRN  Clayton Bibles, CNM 05/25/2020 3:00 PM

## 2020-05-25 NOTE — MAU Note (Signed)
Pt had positive pregnancy test in urgent care on 04/23/20. Does not remember when her LMP was. Came here wanting to start PRenatal cae because that is where urgent care told her to go. She has no complaints at this time. Denies any pain or bleedding.

## 2020-06-21 ENCOUNTER — Other Ambulatory Visit: Payer: Self-pay

## 2020-06-21 ENCOUNTER — Ambulatory Visit (INDEPENDENT_AMBULATORY_CARE_PROVIDER_SITE_OTHER): Payer: Medicaid Other

## 2020-06-21 ENCOUNTER — Ambulatory Visit (INDEPENDENT_AMBULATORY_CARE_PROVIDER_SITE_OTHER): Payer: Medicaid Other | Admitting: Obstetrics

## 2020-06-21 ENCOUNTER — Encounter: Payer: Self-pay | Admitting: Obstetrics

## 2020-06-21 ENCOUNTER — Other Ambulatory Visit (HOSPITAL_COMMUNITY)
Admission: RE | Admit: 2020-06-21 | Discharge: 2020-06-21 | Disposition: A | Discharge status: Home / Self Care | Payer: Medicaid Other | Source: Ambulatory Visit | Attending: Obstetrics | Admitting: Obstetrics

## 2020-06-21 VITALS — BP 108/70 | HR 81 | Wt 151.8 lb

## 2020-06-21 DIAGNOSIS — Z3687 Encounter for antenatal screening for uncertain dates: Secondary | ICD-10-CM

## 2020-06-21 DIAGNOSIS — Z3A27 27 weeks gestation of pregnancy: Secondary | ICD-10-CM | POA: Diagnosis not present

## 2020-06-21 DIAGNOSIS — Z3480 Encounter for supervision of other normal pregnancy, unspecified trimester: Secondary | ICD-10-CM | POA: Insufficient documentation

## 2020-06-21 DIAGNOSIS — Z3482 Encounter for supervision of other normal pregnancy, second trimester: Secondary | ICD-10-CM | POA: Diagnosis not present

## 2020-06-21 IMAGING — US US OB LIMITED
1 series · 6 of 6 positions shown · non-contrast
Comparison: none

[Series 1: us ob limited · 6 of 6 slices shown]
[im 1/6]
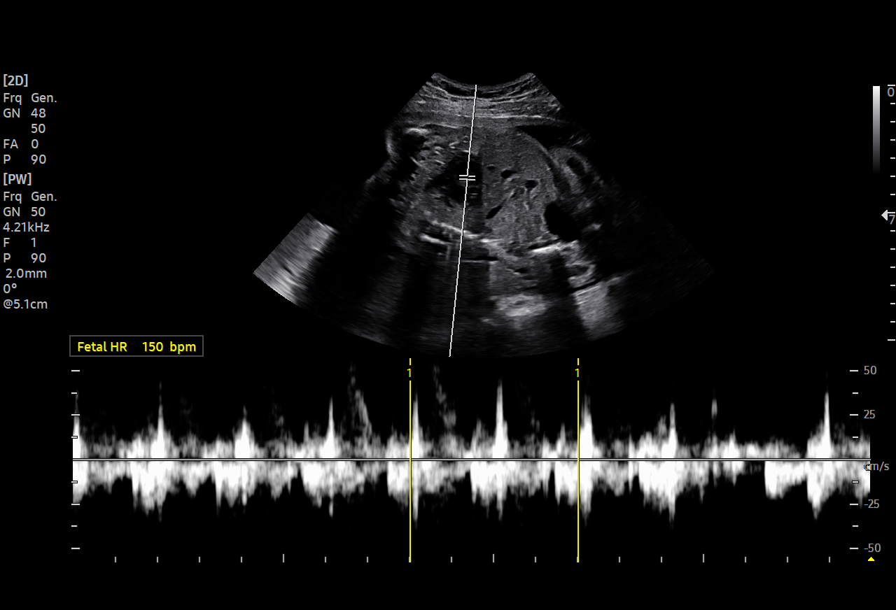
[im 2/6]
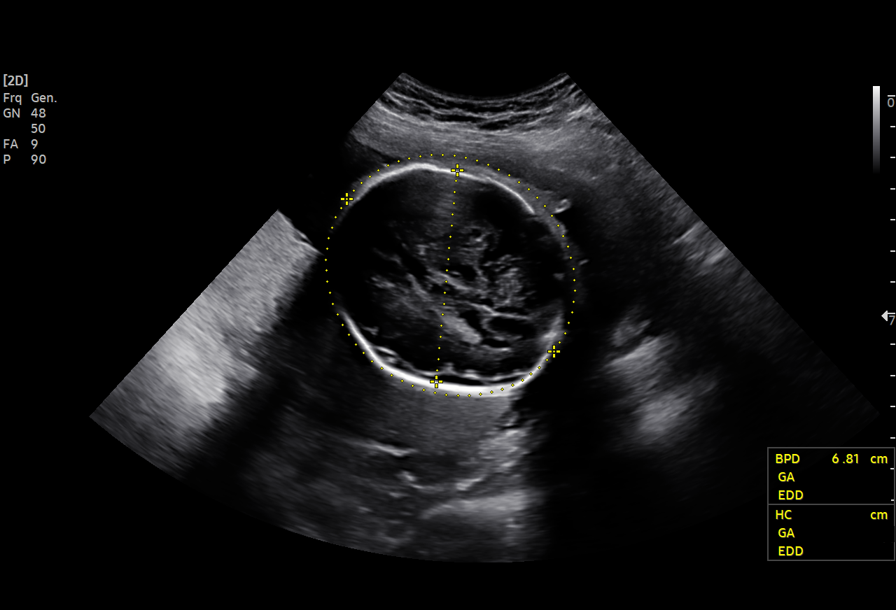
[im 3/6]
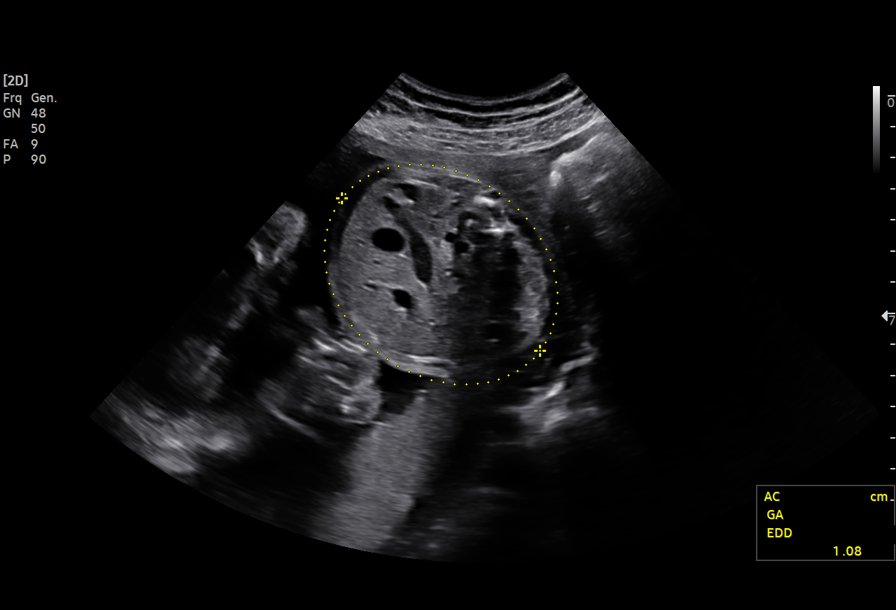
[im 4/6]
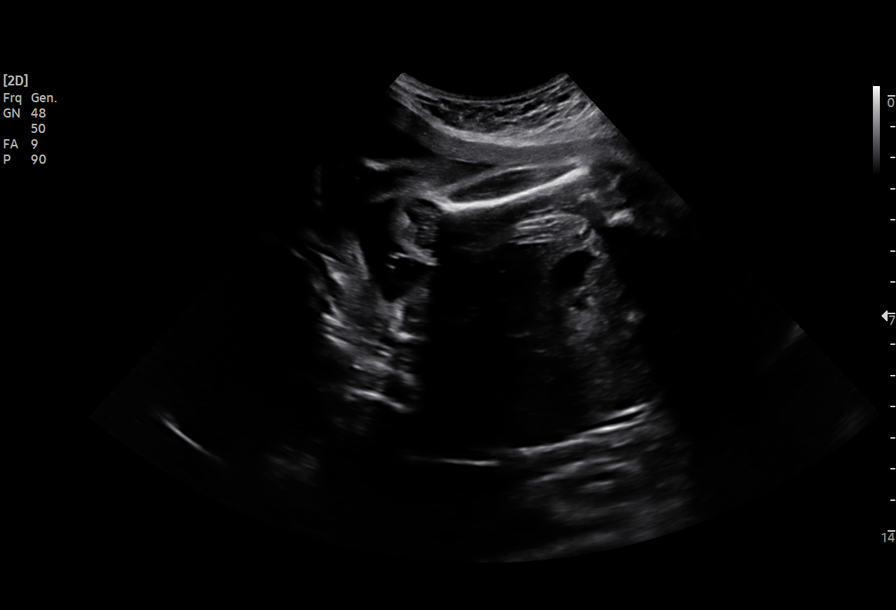
[im 5/6]
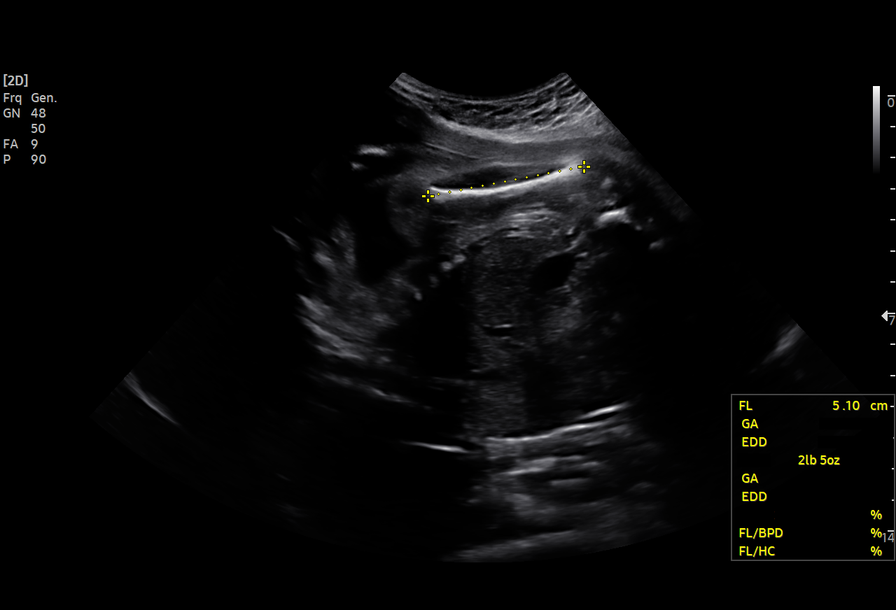
[im 6/6]
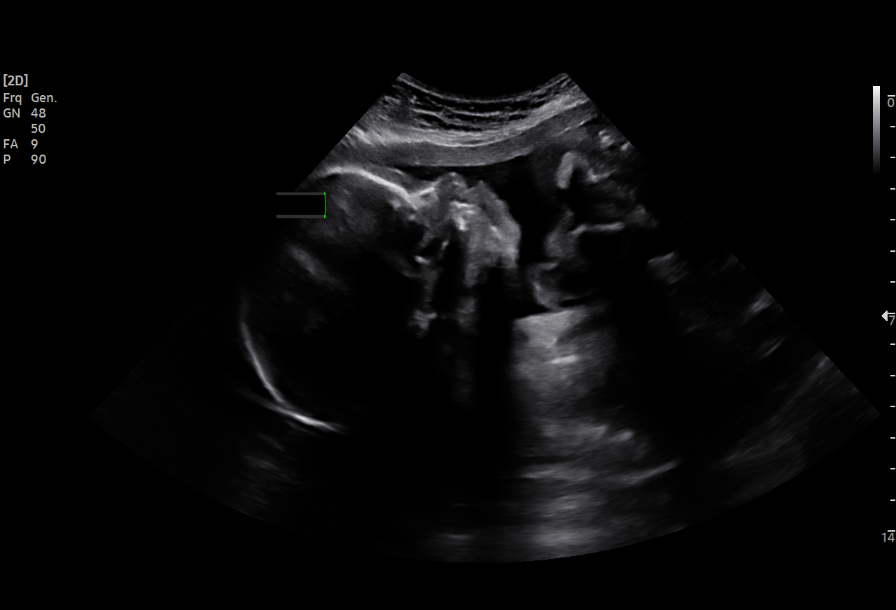

[6 of 6 positions shown; findings below may reference images not displayed]

Healthcare

 1   [HOSPITAL]                        [address].0      JOONSUB

Indications

 27 weeks gestation of pregnancy
 Encounter for uncertain dates                   [CN]
 Late prenatal care, second trimester            [CN]
Fetal Evaluation

 Num Of Fetuses:          1
 Fetal Heart Rate(bpm):   150
 Cardiac Activity:        Observed
 Presentation:            Breech
Biometry

 BPD:      68.1   mm     G. Age:  27w 3d         42  %    CI:         76.38  %    70 - 86
                                                          FL/HC:       20.7  %    18.6 -
 HC:      246.9   mm     G. Age:  26w 6d         11  %    HC/AC:       1.08       1.05 -
 AC:      228.4   mm     G. Age:  27w 2d         39  %    FL/BPD:      74.9  %    71 - 87
 FL:         51   mm     G. Age:  27w 2d         36  %    FL/AC:       22.3  %    20 - 24

 Est. FW:    [CN]   gm      2 lb 5 oz     35  %
Gestational Age

 U/S Today:      27w 2d                                         EDD:  [DATE]
 Best:           27w 2d    Det. By:  U/S ([DATE])             EDD:  [DATE]
Comments
 Single live IUP at [CN] by u/s. Unsure LMP
Impression

 Viable fetus in breech presentation
Recommendations

 Follow up anatomy ultrasound
                 JOONSUB

## 2020-06-21 MED ORDER — BLOOD PRESSURE KIT DEVI: 0 refills | Status: DC

## 2020-06-21 MED ORDER — GOJJI WEIGHT SCALE MISC: 1.0000 | 0 refills | Status: DC | PRN

## 2020-06-21 NOTE — Progress Notes (Signed)
Pt presents for late care NOB. She didn't know she was pregnant. Unsure LMP  This is a planned pregnancy and FOB is involved. Pt c/o HA's.

## 2020-06-21 NOTE — Progress Notes (Signed)
Subjective:    Sierra Thomas is being seen today for her first obstetrical visit.  This is a planned pregnancy. She is at [redacted]w[redacted]d gestation. Her obstetrical history is significant for none. Relationship with FOB: significant other, living together. Patient does intend to breast feed. Pregnancy history fully reviewed.  The information documented in the HPI was reviewed and verified.  Menstrual History: OB History    Gravida  2   Para  1   Term  1   Preterm      AB      Living  1     SAB      TAB      Ectopic      Multiple  0   Live Births  1            Patient's last menstrual period was 12/13/2019.    Past Medical History:  Diagnosis Date  . Anemia   . Medical history non-contributory     Past Surgical History:  Procedure Laterality Date  . NO PAST SURGERIES      (Not in a hospital admission)  No Known Allergies  Social History   Tobacco Use  . Smoking status: Never Smoker  . Smokeless tobacco: Never Used  Substance Use Topics  . Alcohol use: Never    Family History  Problem Relation Age of Onset  . Healthy Mother   . Healthy Father      Review of Systems Constitutional: negative for weight loss Gastrointestinal: negative for vomiting Genitourinary:negative for genital lesions and vaginal discharge and dysuria Musculoskeletal:negative for back pain Behavioral/Psych: negative for abusive relationship, depression, illegal drug usage and tobacco use    Objective:    BP 108/70   Pulse 81   Wt 151 lb 12.8 oz (68.9 kg)   LMP 12/13/2019   BMI 28.68 kg/m  General Appearance:    Alert, cooperative, no distress, appears stated age  Head:    Normocephalic, without obvious abnormality, atraumatic  Eyes:    PERRL, conjunctiva/corneas clear, EOM's intact, fundi    benign, both eyes  Ears:    Normal TM's and external ear canals, both ears  Nose:   Nares normal, septum midline, mucosa normal, no drainage    or sinus tenderness  Throat:    Lips, mucosa, and tongue normal; teeth and gums normal  Neck:   Supple, symmetrical, trachea midline, no adenopathy;    thyroid:  no enlargement/tenderness/nodules; no carotid   bruit or JVD  Back:     Symmetric, no curvature, ROM normal, no CVA tenderness  Lungs:     Clear to auscultation bilaterally, respirations unlabored  Chest Wall:    No tenderness or deformity   Heart:    Regular rate and rhythm, S1 and S2 normal, no murmur, rub   or gallop  Breast Exam:    No tenderness, masses, or nipple abnormality  Abdomen:     Soft, non-tender, bowel sounds active all four quadrants,    no masses, no organomegaly  Genitalia:    Normal female without lesion, discharge or tenderness  Extremities:   Extremities normal, atraumatic, no cyanosis or edema  Pulses:   2+ and symmetric all extremities  Skin:   Skin color, texture, turgor normal, no rashes or lesions  Lymph nodes:   Cervical, supraclavicular, and axillary nodes normal  Neurologic:   CNII-XII intact, normal strength, sensation and reflexes    throughout      Lab Review Urine pregnancy test Labs reviewed yes Radiologic  studies reviewed yes  Assessment:    Pregnancy at [redacted]w[redacted]d weeks    Plan:     1. Supervision of other normal pregnancy, antepartum Rx: - Culture, OB Urine - Genetic Screening - CBC/D/Plt+RPR+Rh+ABO+Rub Ab... - Cervicovaginal ancillary only( [place]) - Cytology - PAP - Enroll Patient in Babyscripts - Glucose Tolerance, 2 Hours w/1 Hour - HIV Antibody (routine testing w rflx) - RPR - CBC  2. Unsure of LMP (last menstrual period) as reason for ultrasound scan Rx: - US OB Limited;  US OB Limited (Accession [id number]) (Order [id number]) Imaging Date: 06/21/2020 Department: [facility] Released By: Hamilton Capri, RN (auto-released) Authorizing: Brock Bad, MD  Exam Status  Status  Final [99]  PACS Intelerad Image Link  Show images for US OB Limited Study  Result  Narrative & Impression  ----------------------------------------------------------------------  OBSTETRICS REPORT                         (Signed Final 06/21/2020 09:33 am) ---------------------------------------------------------------------- Patient Info  ID #:        [id number]                          D.O.B.:  May 09, 1991 (29 yrs)  Name:        Sierra Thomas           Visit Date: 06/21/2020 09:22 am ---------------------------------------------------------------------- Performed By  Attending:         Catalina Antigua MD      Ref. Address:      [address]                                                               [place], [place]                                                               [address]  Performed By:      Angelica Pou RN         Location:          [facility]  [place]  Referred By:       Brock BadHARLES A                     Aramis Weil MD ---------------------------------------------------------------------- Orders  #   Description                          Code         Ordered By  1   US OB LIMITED                        76815.0      Coral CeoHARLES Lelon Ikard ----------------------------------------------------------------------  #   Order #                    Accession #                 Episode #  1   [id number]                  [id number] ---------------------------------------------------------------------- Indications  [redacted] weeks gestation of pregnancy                 Z3A.27  Encounter for uncertain dates                   Z36.87  Late prenatal care, second trimester            O09.32 ---------------------------------------------------------------------- Fetal Evaluation  Num Of Fetuses:          1  Fetal Heart Rate(bpm):   150   Cardiac Activity:        Observed  Presentation:            Breech ---------------------------------------------------------------------- Biometry  BPD:      68.1   mm     G. Age:  27w 3d         42  %    CI:         76.38  %    70 - 86                                                           FL/HC:       20.7  %    18.6 - 20.4  HC:      246.9   mm     G. Age:  26w 6d         11  %    HC/AC:       1.08       1.05 - 1.21  AC:      228.4   mm     G. Age:  27w 2d         39  %    FL/BPD:      74.9  %    71 - 87  FL:         51   mm     G. Age:  27w 2d         36  %    FL/AC:       22.3  %    20 - 24  Est. FW:    1044   gm      2 lb 5 oz     35  % ---------------------------------------------------------------------- Gestational Age  U/S Today:      27w 2d                                         EDD:  09/18/20  Best:           27w 2d    Det. By:  U/S (06/21/20)             EDD:  09/18/20 ---------------------------------------------------------------------- Comments  Single live IUP at [redacted]w[redacted]d by u/s. Unsure LMP ---------------------------------------------------------------------- Impression  Viable fetus in breech presentation ---------------------------------------------------------------------- Recommendations  Follow up anatomy ultrasound ----------------------------------------------------------------------                  Catalina Antigua, MD Electronically Signed Final Report   06/21/2020 09:33 am ----------------------------------------------------------------------    Prenatal vitamins.  Counseling provided regarding continued use of seat belts, cessation of alcohol consumption, smoking or use of illicit drugs; infection precautions i.e., influenza/TDAP immunizations, toxoplasmosis,CMV, parvovirus, listeria and varicella; workplace safety, exercise during pregnancy; routine dental care, safe medications, sexual activity, hot tubs, saunas, pools, travel, caffeine use, fish and  methlymercury, potential toxins, hair treatments, varicose veins Weight gain recommendations per IOM guidelines reviewed: underweight/BMI< 18.5--> gain 28 - 40 lbs; normal weight/BMI 18.5 - 24.9--> gain 25 - 35 lbs; overweight/BMI 25 - 29.9--> gain 15 - 25 lbs; obese/BMI >30->gain  11 - 20 lbs Problem list reviewed and updated. FIRST/CF mutation testing/NIPT/QUAD SCREEN/fragile X/Ashkenazi Jewish population testing/Spinal muscular atrophy discussed: requested. Role of ultrasound in pregnancy discussed; fetal survey: requested. Amniocentesis discussed: not indicated.   Orders Placed This Encounter  Procedures  . Culture, OB Urine  . US OB Limited    Standing Status:   Future    Number of Occurrences:   1    Standing Expiration Date:   06/21/2021    Order Specific Question:   Reason for Exam (SYMPTOM  OR DIAGNOSIS REQUIRED)    Answer:   Dating    Order Specific Question:   Preferred Imaging Location?    Answer:   Internal  . Genetic Screening    [facility]  . CBC/D/Plt+RPR+Rh+ABO+Rub Ab...  . Glucose Tolerance, 2 Hours w/1 Hour  . HIV Antibody (routine testing w rflx)  . RPR  . CBC    Follow up in 2 weeks. 50% of 20 min visit spent on counseling and coordination of care.    Brock Bad, MD 06/21/2020 10:13 AM

## 2020-06-22 ENCOUNTER — Other Ambulatory Visit: Payer: Self-pay | Admitting: Obstetrics

## 2020-06-22 DIAGNOSIS — B9689 Other specified bacterial agents as the cause of diseases classified elsewhere: Secondary | ICD-10-CM

## 2020-06-22 LAB — CERVICOVAGINAL ANCILLARY ONLY
Bacterial Vaginitis (gardnerella): NEGATIVE
Candida Glabrata: NEGATIVE
Candida Vaginitis: POSITIVE — AB
Chlamydia: NEGATIVE
Comment: NEGATIVE
Comment: NEGATIVE
Comment: NEGATIVE
Comment: NEGATIVE
Comment: NEGATIVE
Comment: NORMAL
Neisseria Gonorrhea: NEGATIVE
Trichomonas: NEGATIVE

## 2020-06-22 MED ORDER — METRONIDAZOLE 500 MG PO TABS: 500.0000 mg | ORAL_TABLET | Freq: Two times a day (BID) | ORAL | 2 refills | Status: DC

## 2020-06-23 LAB — CULTURE, OB URINE

## 2020-06-23 LAB — URINE CULTURE, OB REFLEX: Organism ID, Bacteria: NO GROWTH

## 2020-06-25 LAB — CBC/D/PLT+RPR+RH+ABO+RUB AB...
Basophils Absolute: 0 10*3/uL (ref 0.0–0.2)
Basos: 0 %
EOS (ABSOLUTE): 0.1 10*3/uL (ref 0.0–0.4)
Eos: 1 %
HCV Ab: <0.1 s/co ratio (ref 0.0–0.9)
HIV Screen 4th Generation wRfx: NONREACTIVE
Hematocrit: 34.5 % (ref 34.0–46.6)
Hemoglobin: 12 g/dL (ref 11.1–15.9)
Hepatitis B Surface Ag: NEGATIVE
Immature Grans (Abs): 0 10*3/uL (ref 0.0–0.1)
Immature Granulocytes: 0 %
Lymphocytes Absolute: 1.2 10*3/uL (ref 0.7–3.1)
Lymphs: 26 %
MCH: 33.1 pg — ABNORMAL HIGH (ref 26.6–33.0)
MCHC: 34.8 g/dL (ref 31.5–35.7)
MCV: 95 fL (ref 79–97)
Monocytes Absolute: 0.2 10*3/uL (ref 0.1–0.9)
Monocytes: 5 %
Neutrophils Absolute: 2.9 10*3/uL (ref 1.4–7.0)
Neutrophils: 68 %
Platelets: 150 10*3/uL (ref 150–450)
RBC: 3.62 x10E6/uL — ABNORMAL LOW (ref 3.77–5.28)
RDW: 12.1 % (ref 11.7–15.4)
RPR Ser Ql: NONREACTIVE
Rh Factor: POSITIVE
Rubella Antibodies, IGG: 6.66 index (ref >0.99)
WBC: 4.4 10*3/uL (ref 3.4–10.8)

## 2020-06-25 LAB — CYTOLOGY - PAP
Comment: NEGATIVE
Diagnosis: NEGATIVE
Diagnosis: REACTIVE
High risk HPV: NEGATIVE

## 2020-06-25 LAB — GLUCOSE TOLERANCE, 2 HOURS W/ 1HR
Glucose, 1 hour: 99 mg/dL (ref 65–179)
Glucose, 2 hour: 72 mg/dL (ref 65–152)
Glucose, Fasting: 73 mg/dL (ref 65–91)

## 2020-06-25 LAB — AB SCR+ANTIBODY ID: Antibody Screen: POSITIVE — AB

## 2020-06-25 LAB — HCV INTERPRETATION

## 2020-06-29 ENCOUNTER — Encounter: Payer: Self-pay | Admitting: Obstetrics

## 2020-07-05 ENCOUNTER — Other Ambulatory Visit: Payer: Self-pay

## 2020-07-05 ENCOUNTER — Encounter: Payer: Medicaid Other | Admitting: Obstetrics and Gynecology

## 2020-07-05 ENCOUNTER — Ambulatory Visit (INDEPENDENT_AMBULATORY_CARE_PROVIDER_SITE_OTHER): Payer: Medicaid Other | Admitting: Obstetrics and Gynecology

## 2020-07-05 ENCOUNTER — Encounter: Payer: Self-pay | Admitting: Obstetrics

## 2020-07-05 DIAGNOSIS — Z3480 Encounter for supervision of other normal pregnancy, unspecified trimester: Secondary | ICD-10-CM

## 2020-07-05 DIAGNOSIS — Z3A29 29 weeks gestation of pregnancy: Secondary | ICD-10-CM

## 2020-07-05 NOTE — Progress Notes (Signed)
Pt reports fetal movement, denies pain.  

## 2020-07-05 NOTE — Patient Instructions (Signed)

## 2020-07-05 NOTE — Progress Notes (Signed)
   PRENATAL VISIT NOTE  Subjective:  Lexia Vandevender is a 29 y.o. G2P1001 at [redacted]w[redacted]d being seen today for ongoing prenatal care.  She is currently monitored for the following issues for this low-risk pregnancy and has Intrauterine pregnancy; Anemia; Supervision of other normal pregnancy, antepartum; and [redacted] weeks gestation of pregnancy on their problem list.  Patient doing well with no acute concerns today. She reports no complaints.  Contractions: Not present. Vag. Bleeding: None.  Movement: Present. Denies leaking of fluid.   The following portions of the patient's history were reviewed and updated as appropriate: allergies, current medications, past family history, past medical history, past social history, past surgical history and problem list. Problem list updated.  Objective:   Vitals:   07/05/20 1112  BP: 108/65  Pulse: 82  Weight: 149 lb 11.2 oz (67.9 kg)    Fetal Status: Fetal Heart Rate (bpm): 145   Movement: Present     General:  Alert, oriented and cooperative. Patient is in no acute distress.  Skin: Skin is warm and dry. No rash noted.   Cardiovascular: Normal heart rate noted  Respiratory: Normal respiratory effort, no problems with respiration noted  Abdomen: Soft, gravid, appropriate for gestational age.  Pain/Pressure: Absent     Pelvic: Cervical exam deferred        Extremities: Normal range of motion.  Edema: None  Mental Status:  Normal mood and affect. Normal behavior. Normal judgment and thought content.   Assessment and Plan:  Pregnancy: G2P1001 at [redacted]w[redacted]d  1. Supervision of other normal pregnancy, antepartum Recheck antibody screen next visit  2. [redacted] weeks gestation of pregnancy   Preterm labor symptoms and general obstetric precautions including but not limited to vaginal bleeding, contractions, leaking of fluid and fetal movement were reviewed in detail with the patient.  Please refer to After Visit Summary for other counseling recommendations.    Return in about 2 weeks (around 07/19/2020) for ROB, in person.   Mariel Aloe, MD

## 2020-07-19 ENCOUNTER — Other Ambulatory Visit: Payer: Self-pay

## 2020-07-19 ENCOUNTER — Ambulatory Visit (INDEPENDENT_AMBULATORY_CARE_PROVIDER_SITE_OTHER): Payer: Medicaid Other | Admitting: Women's Health

## 2020-07-19 ENCOUNTER — Encounter: Payer: Self-pay | Admitting: Women's Health

## 2020-07-19 VITALS — BP 100/62 | HR 77 | Wt 153.0 lb

## 2020-07-19 DIAGNOSIS — R760 Raised antibody titer: Secondary | ICD-10-CM

## 2020-07-19 DIAGNOSIS — Z3480 Encounter for supervision of other normal pregnancy, unspecified trimester: Secondary | ICD-10-CM

## 2020-07-19 DIAGNOSIS — Z23 Encounter for immunization: Secondary | ICD-10-CM | POA: Diagnosis not present

## 2020-07-19 MED ORDER — ACETAMINOPHEN 500 MG PO TABS: 1000.0000 mg | ORAL_TABLET | Freq: Four times a day (QID) | ORAL | 0 refills | Status: DC | PRN

## 2020-07-19 NOTE — Addendum Note (Signed)
Addended by: Donia Ast E on: 07/19/2020 12:02 PM   Modules accepted: Orders

## 2020-07-19 NOTE — Progress Notes (Addendum)
Subjective:  Sierra Thomas is a 29 y.o. G2P1001 at [redacted]w[redacted]d being seen today for ongoing prenatal care.  She is currently monitored for the following issues for this low-risk pregnancy and has Supervision of other normal pregnancy, antepartum and Abnormal antibody titer on their problem list.  Patient reports no complaints.  Contractions: Not present. Vag. Bleeding: None.  Movement: Present. Denies leaking of fluid.   The following portions of the patient's history were reviewed and updated as appropriate: allergies, current medications, past family history, past medical history, past social history, past surgical history and problem list. Problem list updated.  Objective:   Vitals:   07/19/20 1130  BP: 100/62  Pulse: 77  Weight: 153 lb (69.4 kg)    Fetal Status: Fetal Heart Rate (bpm): 136 Fundal Height: 31 cm Movement: Present     General:  Alert, oriented and cooperative. Patient is in no acute distress.  Skin: Skin is warm and dry. No rash noted.   Cardiovascular: Normal heart rate noted  Respiratory: Normal respiratory effort, no problems with respiration noted  Abdomen: Soft, gravid, appropriate for gestational age. Pain/Pressure: Absent     Pelvic: Vag. Bleeding: None     Cervical exam deferred        Extremities: Normal range of motion.  Edema: None  Mental Status: Normal mood and affect. Normal behavior. Normal judgment and thought content.   Urinalysis:      Assessment and Plan:  Pregnancy: G2P1001 at [redacted]w[redacted]d  1. Supervision of other normal pregnancy, antepartum -anatomy US ordered -discussed Flu/Tdap, pt elects Tdap today, but does not want flu vaccine today -discussed contraception, pt unsure, discussed LARC, information given -Tylenol sent to pharmacy for patient for occasional mild headaches per pt request -pt considering visiting mother in [place], discussed travel in pregnancy and encouraged hydration, frequent walks, locating nearest hospital with L&D floor and  bringing copy of medical records  2. Abnormal antibody titer -repeat antibody screen today  Preterm labor symptoms and general obstetric precautions including but not limited to vaginal bleeding, contractions, leaking of fluid and fetal movement were reviewed in detail with the patient. I discussed the assessment and treatment plan with the patient. The patient was provided an opportunity to ask questions and all were answered. The patient agreed with the plan and demonstrated an understanding of the instructions. The patient was advised to call back or seek an in-person office evaluation/go to MAU at [facility] for any urgent or concerning symptoms. Please refer to After Visit Summary for other counseling recommendations.  Return in about 2 weeks (around 08/02/2020) for in-person LOB/APP OK, needs anatomy scan ASAP.   Vonita Calloway, Odie Sera, NP

## 2020-07-19 NOTE — Progress Notes (Signed)
Patient reports fetal movement, denies pain. 

## 2020-07-19 NOTE — Patient Instructions (Addendum)
Maternity Assessment Unit (MAU)  The Maternity Assessment Unit (MAU) is located at the [facility] and [facility]. The address is: [address], [place], [place], [place] [address]. Please see map below for additional directions.    The Maternity Assessment Unit is designed to help you during your pregnancy, and for up to 6 weeks after delivery, with any pregnancy- or postpartum-related emergencies, if you think you are in labor, or if your water has broken. For example, if you experience nausea and vomiting, vaginal bleeding, severe abdominal or pelvic pain, elevated blood pressure or other problems related to your pregnancy or postpartum time, please come to the Maternity Assessment Unit for assistance.        Pregnancy and Influenza  Influenza, also called the flu, is an infection of the lungs and airways (respiratory tract). If you are pregnant, you are more likely to catch the flu. You are also more likely to have a more serious case of the flu. This is because pregnancy causes changes to your body's disease-fighting system (immune system), heart, and lungs. If you develop a bad case of the flu, especially with a high fever, this can cause problems for you and your developing baby. How do people get the flu? The flu is caused by a type of germ called a virus. It spreads when virus particles get passed from person to person by:  Being near a sick person who is coughing or sneezing.  Touching something that has the virus on it and then touching your mouth, nose, or face. The influenza virus is most common during the fall and winter. How can I protect myself against the flu?  Get a flu shot. The best way to prevent the flu is to get a flu shot before flu season starts. The flu shot is not dangerous for your developing baby. It may even help protect your baby from the flu for up to 6 months after birth.  Wash your hands often with soap and warm water.  If soap and water are not available, use hand sanitizer.  Do not come in close contact with sick people.  Do not share food, drinks, or utensils with other people.  Avoid touching your eyes, nose, and mouth.  Clean frequently used surfaces at home, school, or work.  Practice healthy lifestyle habits, such as: ? Eating a healthy, balanced diet. ? Drinking plenty of fluids. ? Exercising regularly or as told by your health care provider. ? Sleeping 7-9 hours each night. ? Finding ways to manage stress. What should I do if I have flu symptoms?  If you have any symptoms of the flu, even after getting a flu shot, contact your health care provider right away.  To reduce fever, take over-the-counter acetaminophen as told by your health care provider.  If you have the flu, you may get antiviral medicine to keep the flu from becoming severe and to shorten how long it lasts.  Avoid spreading the flu to others: ? Stay home until you are well. ? Cover your nose and mouth when you cough or sneeze. ? Wash your hands often. Follow these instructions at home:  Take over-the-counter and prescription medicines only as told by your health care provider. Do not take any medicine, including cold or flu medicine, unless your health care provider tells you to do so.  If you were prescribed antiviral medicine, take it as told by your health care provider. Do not stop taking the antiviral  medicine even if you start to feel better.  Eat a nutrient-rich diet that includes fresh fruits and vegetables, whole grains, lean protein, and low-fat dairy.  Drink enough fluid to keep your urine clear or pale yellow.  Get plenty of rest. Contact a health care provider if:  You have fever or chills.  You have a cough, sore throat, or stuffy nose.  You have worsening or unusual: ? Muscle aches. ? Headache. ? Tiredness. ? Loss of appetite.  You have vomiting or diarrhea. Get help right away if:  You have  trouble breathing.  You have chest pain.  You have abdominal pain.  You begin to have labor pains.  You have a fever that does not go down 24 hours after you take medicine.  You do not feel your baby move.  You have diarrhea or vomiting that will not go away.  You have dizziness or confusion.  Your symptoms do not improve, even with treatment. Summary  If you are pregnant, you are more likely to catch the flu. You are also more likely to have a more serious case of the flu.  If you have flu-like symptoms, call your health care provider right away. If you develop a bad case of the flu, especially with a high fever, this can be dangerous for your developing baby.  The best way to prevent the flu is to get a flu shot before flu season starts. The flu shot is not dangerous for your developing baby.  If you have the flu and were prescribed antiviral medicine, take it as told by your health care provider. This information is not intended to replace advice given to you by your health care provider. Make sure you discuss any questions you have with your health care provider. Document Revised: 11/08/2018 Document Reviewed: 09/12/2016 Elsevier Patient Education  2020 [employer]       https://www.cdc.gov/vaccines/hcp/vis/vis-statements/tdap.pdf">  Tdap (Tetanus, Diphtheria, Pertussis) Vaccine: What You Need to Know 1. Why get vaccinated? Tdap vaccine can prevent tetanus, diphtheria, and pertussis. Diphtheria and pertussis spread from person to person. Tetanus enters the body through cuts or wounds.  TETANUS (T) causes painful stiffening of the muscles. Tetanus can lead to serious health problems, including being unable to open the mouth, having trouble swallowing and breathing, or death.  DIPHTHERIA (D) can lead to difficulty breathing, heart failure, paralysis, or death.  PERTUSSIS (aP), also known as "whooping cough," can cause uncontrollable, violent coughing which makes it  hard to breathe, eat, or drink. Pertussis can be extremely serious in babies and young children, causing pneumonia, convulsions, brain damage, or death. In teens and adults, it can cause weight loss, loss of bladder control, passing out, and rib fractures from severe coughing. 2. Tdap vaccine Tdap is only for children 7 years and older, adolescents, and adults.  Adolescents should receive a single dose of Tdap, preferably at age 30 or 12 years. Pregnant women should get a dose of Tdap during every pregnancy, to protect the newborn from pertussis. Infants are most at risk for severe, life-threatening complications from pertussis. Adults who have never received Tdap should get a dose of Tdap. Also, adults should receive a booster dose every 10 years, or earlier in the case of a severe and dirty wound or burn. Booster doses can be either Tdap or Td (a different vaccine that protects against tetanus and diphtheria but not pertussis). Tdap may be given at the same time as other vaccines. 3. Talk with your health care  provider Tell your vaccine provider if the person getting the vaccine:  Has had an allergic reaction after a previous dose of any vaccine that protects against tetanus, diphtheria, or pertussis, or has any severe, life-threatening allergies.  Has had a coma, decreased level of consciousness, or prolonged seizures within 7 days after a previous dose of any pertussis vaccine (DTP, DTaP, or Tdap).  Has seizures or another nervous system problem.  Has ever had Guillain-Barr Syndrome (also called GBS).  Has had severe pain or swelling after a previous dose of any vaccine that protects against tetanus or diphtheria. In some cases, your health care provider may decide to postpone Tdap vaccination to a future visit.  People with minor illnesses, such as a cold, may be vaccinated. People who are moderately or severely ill should usually wait until they recover before getting Tdap vaccine.  Your  health care provider can give you more information. 4. Risks of a vaccine reaction  Pain, redness, or swelling where the shot was given, mild fever, headache, feeling tired, and nausea, vomiting, diarrhea, or stomachache sometimes happen after Tdap vaccine. People sometimes faint after medical procedures, including vaccination. Tell your provider if you feel dizzy or have vision changes or ringing in the ears.  As with any medicine, there is a very remote chance of a vaccine causing a severe allergic reaction, other serious injury, or death. 5. What if there is a serious problem? An allergic reaction could occur after the vaccinated person leaves the clinic. If you see signs of a severe allergic reaction (hives, swelling of the face and throat, difficulty breathing, a fast heartbeat, dizziness, or weakness), call 9-1-1 and get the person to the nearest hospital. For other signs that concern you, call your health care provider.  Adverse reactions should be reported to the Vaccine Adverse Event Reporting System (VAERS). Your health care provider will usually file this report, or you can do it yourself. Visit the VAERS website at www.vaers.LAgents.no or call 239-732-8738. VAERS is only for reporting reactions, and VAERS staff do not give medical advice. 6. The National Vaccine Injury Compensation Program The [employer] Vaccine Injury Compensation Program (VICP) is a federal program that was created to compensate people who may have been injured by certain vaccines. Visit the VICP website at SpiritualWord.at or call 9845964166 to learn about the program and about filing a claim. There is a time limit to file a claim for compensation. 7. How can I learn more?  Ask your health care provider.  Call your local or state health department.  Contact the Centers for Disease Control and Prevention (CDC): ? Call 801-012-4040 (1-800-CDC-INFO) or ? Visit CDC's website at  PicCapture.uy Vaccine Information Statement Tdap (Tetanus, Diphtheria, Pertussis) Vaccine (10/30/2018) This information is not intended to replace advice given to you by your health care provider. Make sure you discuss any questions you have with your health care provider. Document Revised: 11/08/2018 Document Reviewed: 11/11/2018 Elsevier Patient Education  2020 [employer].        Contraception Choices Contraception, also called birth control, refers to methods or devices that prevent pregnancy. Hormonal methods Contraceptive implant  A contraceptive implant is a thin, plastic tube that contains a hormone. It is inserted into the upper part of the arm. It can remain in place for up to 3 years. Progestin-only injections Progestin-only injections are injections of progestin, a synthetic form of the hormone progesterone. They are given every 3 months by a health care provider. Birth control pills  Birth  control pills are pills that contain hormones that prevent pregnancy. They must be taken once a day, preferably at the same time each day. Birth control patch  The birth control patch contains hormones that prevent pregnancy. It is placed on the skin and must be changed once a week for three weeks and removed on the fourth week. A prescription is needed to use this method of contraception. Vaginal ring  A vaginal ring contains hormones that prevent pregnancy. It is placed in the vagina for three weeks and removed on the fourth week. After that, the process is repeated with a new ring. A prescription is needed to use this method of contraception. Emergency contraceptive Emergency contraceptives prevent pregnancy after unprotected sex. They come in pill form and can be taken up to 5 days after sex. They work best the sooner they are taken after having sex. Most emergency contraceptives are available without a prescription. This method should not be used as your only form of birth  control. Barrier methods Female condom  A female condom is a thin sheath that is worn over the penis during sex. Condoms keep sperm from going inside a woman's body. They can be used with a spermicide to increase their effectiveness. They should be disposed after a single use. Female condom  A female condom is a soft, loose-fitting sheath that is put into the vagina before sex. The condom keeps sperm from going inside a woman's body. They should be disposed after a single use. Diaphragm  A diaphragm is a soft, dome-shaped barrier. It is inserted into the vagina before sex, along with a spermicide. The diaphragm blocks sperm from entering the uterus, and the spermicide kills sperm. A diaphragm should be left in the vagina for 6-8 hours after sex and removed within 24 hours. A diaphragm is prescribed and fitted by a health care provider. A diaphragm should be replaced every 1-2 years, after giving birth, after gaining more than 15 lb (6.8 kg), and after pelvic surgery. Cervical cap  A cervical cap is a round, soft latex or plastic cup that fits over the cervix. It is inserted into the vagina before sex, along with spermicide. It blocks sperm from entering the uterus. The cap should be left in place for 6-8 hours after sex and removed within 48 hours. A cervical cap must be prescribed and fitted by a health care provider. It should be replaced every 2 years. Sponge  A sponge is a soft, circular piece of polyurethane foam with spermicide on it. The sponge helps block sperm from entering the uterus, and the spermicide kills sperm. To use it, you make it wet and then insert it into the vagina. It should be inserted before sex, left in for at least 6 hours after sex, and removed and thrown away within 30 hours. Spermicides Spermicides are chemicals that kill or block sperm from entering the cervix and uterus. They can come as a cream, jelly, suppository, foam, or tablet. A spermicide should be inserted into  the vagina with an applicator at least 10-15 minutes before sex to allow time for it to work. The process must be repeated every time you have sex. Spermicides do not require a prescription. Intrauterine contraception Intrauterine device (IUD) An IUD is a T-shaped device that is put in a woman's uterus. There are two types:  Hormone IUD.This type contains progestin, a synthetic form of the hormone progesterone. This type can stay in place for 3-5 years.  Copper IUD.This type is  wrapped in copper wire. It can stay in place for 10 years.  Permanent methods of contraception Female tubal ligation In this method, a woman's fallopian tubes are sealed, tied, or blocked during surgery to prevent eggs from traveling to the uterus. Hysteroscopic sterilization In this method, a small, flexible insert is placed into each fallopian tube. The inserts cause scar tissue to form in the fallopian tubes and block them, so sperm cannot reach an egg. The procedure takes about 3 months to be effective. Another form of birth control must be used during those 3 months. Female sterilization This is a procedure to tie off the tubes that carry sperm (vasectomy). After the procedure, the man can still ejaculate fluid (semen). Natural planning methods Natural family planning In this method, a couple does not have sex on days when the woman could become pregnant. Calendar method This means keeping track of the length of each menstrual cycle, identifying the days when pregnancy can happen, and not having sex on those days. Ovulation method In this method, a couple avoids sex during ovulation. Symptothermal method This method involves not having sex during ovulation. The woman typically checks for ovulation by watching changes in her temperature and in the consistency of cervical mucus. Post-ovulation method In this method, a couple waits to have sex until after ovulation. Summary  Contraception, also called birth  control, means methods or devices that prevent pregnancy.  Hormonal methods of contraception include implants, injections, pills, patches, vaginal rings, and emergency contraceptives.  Barrier methods of contraception can include female condoms, female condoms, diaphragms, cervical caps, sponges, and spermicides.  There are two types of IUDs (intrauterine devices). An IUD can be put in a woman's uterus to prevent pregnancy for 3-5 years.  Permanent sterilization can be done through a procedure for males, females, or both.  Natural family planning methods involve not having sex on days when the woman could become pregnant. This information is not intended to replace advice given to you by your health care provider. Make sure you discuss any questions you have with your health care provider. Document Revised: 07/19/2017 Document Reviewed: 08/19/2016 Elsevier Patient Education  2020 [employer].        Preterm Labor and Birth Information  The normal length of a pregnancy is 39-41 weeks. Preterm labor is when labor starts before 37 completed weeks of pregnancy. What are the risk factors for preterm labor? Preterm labor is more likely to occur in women who:  Have certain infections during pregnancy such as a bladder infection, sexually transmitted infection, or infection inside the uterus (chorioamnionitis).  Have a shorter-than-normal cervix.  Have gone into preterm labor before.  Have had surgery on their cervix.  Are younger than age 35 or older than age 69.  Are African American.  Are pregnant with twins or multiple babies (multiple gestation).  Take street drugs or smoke while pregnant.  Do not gain enough weight while pregnant.  Became pregnant shortly after having been pregnant. What are the symptoms of preterm labor? Symptoms of preterm labor include:  Cramps similar to those that can happen during a menstrual period. The cramps may happen with diarrhea.  Pain in  the abdomen or lower back.  Regular uterine contractions that may feel like tightening of the abdomen.  A feeling of increased pressure in the pelvis.  Increased watery or bloody mucus discharge from the vagina.  Water breaking (ruptured amniotic sac). Why is it important to recognize signs of preterm labor? It is important  to recognize signs of preterm labor because babies who are born prematurely may not be fully developed. This can put them at an increased risk for:  Long-term (chronic) heart and lung problems.  Difficulty immediately after birth with regulating body systems, including blood sugar, body temperature, heart rate, and breathing rate.  Bleeding in the brain.  Cerebral palsy.  Learning difficulties.  Death. These risks are highest for babies who are born before 34 weeks of pregnancy. How is preterm labor treated? Treatment depends on the length of your pregnancy, your condition, and the health of your baby. It may involve:  Having a stitch (suture) placed in your cervix to prevent your cervix from opening too early (cerclage).  Taking or being given medicines, such as: ? Hormone medicines. These may be given early in pregnancy to help support the pregnancy. ? Medicine to stop contractions. ? Medicines to help mature the baby's lungs. These may be prescribed if the risk of delivery is high. ? Medicines to prevent your baby from developing cerebral palsy. If the labor happens before 34 weeks of pregnancy, you may need to stay in the hospital. What should I do if I think I am in preterm labor? If you think that you are going into preterm labor, call your health care provider right away. How can I prevent preterm labor in future pregnancies? To increase your chance of having a full-term pregnancy:  Do not use any tobacco products, such as cigarettes, chewing tobacco, and e-cigarettes. If you need help quitting, ask your health care provider.  Do not use street  drugs or medicines that have not been prescribed to you during your pregnancy.  Talk with your health care provider before taking any herbal supplements, even if you have been taking them regularly.  Make sure you gain a healthy amount of weight during your pregnancy.  Watch for infection. If you think that you might have an infection, get it checked right away.  Make sure to tell your health care provider if you have gone into preterm labor before. This information is not intended to replace advice given to you by your health care provider. Make sure you discuss any questions you have with your health care provider. Document Revised: 11/08/2018 Document Reviewed: 12/08/2015 Elsevier Patient Education  2020 [employer].        Pregnancy and Travel  Most pregnant women can safely travel until the last month of their pregnancy. Your doctor may recommend limiting or avoiding travel depending on how far you are in the pregnancy, and if you have any medical or pregnancy problems. General travel tips Before you go:  Discuss your trip with your doctor. Get examined shortly before you go.  Get a copy of your medical records. Take it with you.  Try to get names of doctors and hospitals in the area where you will be visiting.  Pack your pillow.  Pack any approved medicines and supplements.  Get enough sleep the night before the trip. During your trip:  Ask for locations of doctors and hospitals.  Wear flat, comfortable shoes.  Wear loose-fitting, comfortable clothes.  Wear compression stockings as told by your doctor. They may prevent blood clots that arise from sitting for a long time.  Do leg exercises as told by your doctor.  Eat a balanced diet, drink lots of fluid, and take your vitamins and supplements.  Take water, crackers, and fruit with you.  Take breaks to use the restroom and walk every 2 hours  or during stops.  Do not wear yourself out.  Do not ride on a  motorcycle.  Rest. If your trip is long, lie down for 30 or more minutes with your feet slightly raised after you reach your destination.  Always wear a seat belt. Tips for traveling to a foreign country Before you go:  Ask your doctor if there are medicines that are safe for you to take if you get diarrhea, constipation, nausea, or vomiting.  Check with your health insurance provider about medical coverage abroad. Purchase travel [employer], if needed.  Make sure you are up to date on vaccines. During your trip:  Do not eat uncooked foods.  Do not eat food from buffets or food that is cold or sitting at room temperature.  Drink bottled beverages and water. Do not use ice.  Wash fruits and vegetables with clean water. If possible, peel them before eating.  Do not drink unpasteurized milk.  Wear insect repellent if there are mosquitoes or other biting insects. Ask your doctor which repellents are safe. What do I need to know about traveling by car?  Wear your seat belt properly. The belt should be buckled below your abdomen, on your hip bones. The shoulder belt should be off to the side of your abdomen and across the center of your chest.  If you are in the front seat, sit as far away from the dashboard as possible to avoid getting hit hard if the airbag deploys in an accident.  Do not travel for more than 5-6 hours a day. What do I need to know about traveling by bus?  Before making a reservation, ask whether your bus will have a restroom.  Move your arms and legs when seated.  If you have to use the restroom, hold on to the seats and handrails as you walk.  Do not travel for more than 5-6 hours a day. What do I need to know about traveling by train?  Before making a reservation, ask if your train will have a sleeping car and more than one restroom.  If you need to walk while the train is moving, hold on to seats and handrails.  Move your arms and legs when  seated.  Do not travel for more than 5-6 hours a day. What do I need to know about traveling by airplane?  Before booking your trip, ask about the airline's rules about pregnancy. Pregnant women may be restricted from flying after a certain time of the pregnancy. Every airline has its own rules.  Make sure you complete your trip before 36 weeks of pregnancy.  Ask whether the airplane cabin will be pressurized. Do not board an unpressurized plane that will fly above 7,000 ft (2,100 m).  Try to get a bulkhead or an aisle seat so it is easier to get up, stretch, and use the bathroom.  Wear layers since the cabin temperature can change.  Put all your medicines and medical records in your carry-on bag.  Avoid drinking caffeinated or carbonated beverages.  Avoid eating foods that may make you bloated.  Do not eat a big meal.  If you need to walk through the airplane, hold on to the seats and handrails.  Move your arms and legs when seated.  Wear your seat belt. What do I need to know about traveling by cruise ship?  Before booking your trip, ask the cruise ship company: ? Are pregnant women allowed on the ship? ? Is there a  medical facility and doctor on board? ? Does the ship dock in places where there are doctors and medical facilities?  Before booking your trip, ask your doctor: ? Is it safe to take medicines if I get seasick? ? Is it safe to wear acupressure wristbands to prevent seasickness? If the answer is yes, consider buying one. Contact a health care provider if:  You have diarrhea.  You vomit.  You have nausea or seasickness. Get help right away if:  You have vaginal bleeding.  You have severe vomiting or diarrhea.  You have pelvic or abdominal pain.  You have contractions.  Your water breaks.  You have a persistent headache.  Your eyesight changes or you see spots.  Your face or hands are swollen.  You have pain, warmth, or swelling in your legs or  ankles. Summary  Most pregnant women can safely travel until the last month of their pregnancy. Your doctor may tell you to limit or avoid travel depending on how far you are in your pregnancy and if you have any medical or pregnancy problems.  The best time to travel is between 14 and 28 weeks of your pregnancy.  Before you go on your trip, make sure you discuss your trip with your health care provider, get a copy of your medical records, and try to get information on medical centers and doctors at your destination.  While on your trip, make sure you wear comfortable clothes and shoes, eat a healthy diet, drink plenty of fluids, take your vitamins and supplements, take breaks and rest often, and wear your seat belt.  Before booking a flight, ask about the airline's rules about pregnancy. Every airline has its own rules. Do the same for a train, bus, or cruise ship. This information is not intended to replace advice given to you by your health care provider. Make sure you discuss any questions you have with your health care provider. Document Revised: 12/31/2018 Document Reviewed: 08/22/2016 Elsevier Patient Education  2020 [employer].

## 2020-07-26 LAB — AB SCR+ANTIBODY ID: Antibody Screen: POSITIVE — AB

## 2020-07-26 LAB — ANTIBODY SCREEN

## 2020-07-31 NOTE — L&D Delivery Note (Addendum)
Delivery Note  Sierra Thomas is a 30 y.o. female G2P1001 with IUP at [redacted]w[redacted]d admitted for post-dates IOL .  She progressed with augmentation to complete and pushed spontaneous  Vaginal delivery.   At 21:03 a viable and healthy female was delivered via  (Presentation: vertex ; LOA).   Cord clamped before one (1) minute due to initial APGAR:5  then clamped by SNM and cut by SNM.    Baby slow to transition, NICU team called to room. Baby improved at warmer after stimulation and blow-by oxygen.  APGAR: 5, 8; weight  7lb 10.8 oz (3461g.)    Placenta status: Spontaneous, intact. Bleeding minimal  Cord: Loose nuchal cord, baby delivered through cord without difficulty.  Anesthesia:  none Episiotomy:  none Lacerations:  none Est. Blood Loss (mL):  20ml  Mom and baby stable prior to transfer to postpartum. Baby to Couplet care / Skin to Skin.   She plans on breastfeeding and formula.   She is undecided for birth control.   Juliann Pares, Student-MidWife [employer] 09/24/2020, 10:33 PM

## 2020-08-03 ENCOUNTER — Inpatient Hospital Stay (HOSPITAL_COMMUNITY)
Admission: AD | Admit: 2020-08-03 | Discharge: 2020-08-03 | Disposition: A | Discharge status: Home / Self Care | Payer: Medicaid Other | Attending: Obstetrics and Gynecology | Admitting: Obstetrics and Gynecology

## 2020-08-03 ENCOUNTER — Ambulatory Visit (INDEPENDENT_AMBULATORY_CARE_PROVIDER_SITE_OTHER): Payer: Medicaid Other | Admitting: Obstetrics & Gynecology

## 2020-08-03 ENCOUNTER — Other Ambulatory Visit: Payer: Self-pay

## 2020-08-03 ENCOUNTER — Other Ambulatory Visit: Payer: Self-pay | Admitting: Obstetrics & Gynecology

## 2020-08-03 VITALS — BP 111/69 | HR 94 | Wt 153.9 lb

## 2020-08-03 DIAGNOSIS — Z3A33 33 weeks gestation of pregnancy: Secondary | ICD-10-CM

## 2020-08-03 DIAGNOSIS — O26893 Other specified pregnancy related conditions, third trimester: Secondary | ICD-10-CM | POA: Diagnosis not present

## 2020-08-03 DIAGNOSIS — R760 Raised antibody titer: Secondary | ICD-10-CM

## 2020-08-03 DIAGNOSIS — Z789 Other specified health status: Secondary | ICD-10-CM

## 2020-08-03 DIAGNOSIS — O0930 Supervision of pregnancy with insufficient antenatal care, unspecified trimester: Secondary | ICD-10-CM

## 2020-08-03 DIAGNOSIS — Z3480 Encounter for supervision of other normal pregnancy, unspecified trimester: Secondary | ICD-10-CM

## 2020-08-03 NOTE — Patient Instructions (Signed)
Robitussin DM

## 2020-08-03 NOTE — Progress Notes (Signed)
Pt reports fetal movement, denies pain.  

## 2020-08-03 NOTE — MAU Note (Signed)
Patient here for Type and Screen from the office.  Denies any complaints.  Endorses + FM.

## 2020-08-03 NOTE — Progress Notes (Signed)
   PRENATAL VISIT NOTE  Subjective:  Sierra Thomas is a 30 y.o. G2P1001 at [redacted]w[redacted]d being seen today for ongoing prenatal care.  She is currently monitored for the following issues for this high-risk pregnancy and has Supervision of other normal pregnancy, antepartum and Abnormal antibody titer on their problem list.  Patient reports having mild cough.  Would like recommendations for OTC medications.  Contractions: Not present. Vag. Bleeding: None.  Movement: Present. Denies leaking of fluid.   The following portions of the patient's history were reviewed and updated as appropriate: allergies, current medications, past family history, past medical history, past social history, past surgical history and problem list.   Objective:   Vitals:   08/03/20 0914  BP: 111/69  Pulse: 94  Weight: 153 lb 14.4 oz (69.8 kg)    Fetal Status: Fetal Heart Rate (bpm): 132 Fundal Height: 33 cm Movement: Present     General:  Alert, oriented and cooperative. Patient is in no acute distress.  Skin: Skin is warm and dry. No rash noted.   Cardiovascular: Normal heart rate noted  Respiratory: Normal respiratory effort, no problems with respiration noted  Abdomen: Soft, gravid, appropriate for gestational age.  Pain/Pressure: Absent     Pelvic: Cervical exam deferred        Extremities: Normal range of motion.  Edema: None  Mental Status: Normal mood and affect. Normal behavior. Normal judgment and thought content.   Assessment and Plan:  Pregnancy: G2P1001 at [redacted]w[redacted]d 1. [redacted] weeks gestation of pregnancy -Continue PNV -Recheck 2 weeks  2. Supervision of other normal pregnancy, antepartum - has follow up anatomy ultrasound scheduled to complete anatomy scan  3. Abnormal antibody titer - repeat antibody screen positive.  Antenatal testing recommended.  Called MAU today and spoke with Dr. Carron Brazen as well so provider in [facility] aware.  Future order placed for type and screen.    4. Language barrier -  Swahili translator used during visit  5.  Late prenatal care  Preterm labor symptoms and general obstetric precautions including but not limited to vaginal bleeding, contractions, leaking of fluid and fetal movement were reviewed in detail with the patient. Please refer to After Visit Summary for other counseling recommendations.   Return in about 2 weeks (around 08/17/2020) for Office ob visit (MD or APP).  Future Appointments  Date Time Provider Department Center  08/13/2020  7:45 AM [facility] US4 [facility]  08/23/2020 10:45 AM Constant, Gigi Gin, MD [facility] None    Jerene Bears, MD

## 2020-08-13 ENCOUNTER — Ambulatory Visit: Payer: Medicaid Other

## 2020-08-23 ENCOUNTER — Ambulatory Visit (INDEPENDENT_AMBULATORY_CARE_PROVIDER_SITE_OTHER): Payer: Medicaid Other | Admitting: Obstetrics and Gynecology

## 2020-08-23 ENCOUNTER — Other Ambulatory Visit: Payer: Self-pay

## 2020-08-23 ENCOUNTER — Other Ambulatory Visit (HOSPITAL_COMMUNITY)
Admission: RE | Admit: 2020-08-23 | Discharge: 2020-08-23 | Disposition: A | Discharge status: Home / Self Care | Payer: Medicaid Other | Source: Ambulatory Visit | Attending: Obstetrics and Gynecology | Admitting: Obstetrics and Gynecology

## 2020-08-23 ENCOUNTER — Encounter: Payer: Self-pay | Admitting: Obstetrics and Gynecology

## 2020-08-23 VITALS — BP 110/70 | HR 82 | Wt 152.0 lb

## 2020-08-23 DIAGNOSIS — Z3480 Encounter for supervision of other normal pregnancy, unspecified trimester: Secondary | ICD-10-CM | POA: Diagnosis not present

## 2020-08-23 DIAGNOSIS — R760 Raised antibody titer: Secondary | ICD-10-CM

## 2020-08-23 NOTE — Progress Notes (Signed)
   PRENATAL VISIT NOTE  Subjective:  Sierra Thomas is a 30 y.o. G2P1001 at [redacted]w[redacted]d being seen today for ongoing prenatal care.  She is currently monitored for the following issues for this high-risk pregnancy and has Supervision of other normal pregnancy, antepartum and Abnormal antibody titer on their problem list.  Patient reports no complaints.  Contractions: Not present. Vag. Bleeding: None.  Movement: Present. Denies leaking of fluid.   The following portions of the patient's history were reviewed and updated as appropriate: allergies, current medications, past family history, past medical history, past social history, past surgical history and problem list.   Objective:   Vitals:   08/23/20 1037  BP: 110/70  Pulse: 82  Weight: 152 lb (68.9 kg)    Fetal Status: Fetal Heart Rate (bpm): 145 Fundal Height: 36 cm Movement: Present     General:  Alert, oriented and cooperative. Patient is in no acute distress.  Skin: Skin is warm and dry. No rash noted.   Cardiovascular: Normal heart rate noted  Respiratory: Normal respiratory effort, no problems with respiration noted  Abdomen: Soft, gravid, appropriate for gestational age.  Pain/Pressure: Absent     Pelvic: Cervical exam performed in the presence of a chaperone Dilation: 1 Effacement (%): Thick Station: Ballotable  Extremities: Normal range of motion.  Edema: None  Mental Status: Normal mood and affect. Normal behavior. Normal judgment and thought content.   Assessment and Plan:  Pregnancy: G2P1001 at [redacted]w[redacted]d 1. Supervision of other normal pregnancy, antepartum Patient is doing well without complaints Cultures today Patient plans to use same pediatrician Patient is undecided on contraception at this time  2. Abnormal antibody titer Scheduled for ultrasound on 1/28  Preterm labor symptoms and general obstetric precautions including but not limited to vaginal bleeding, contractions, leaking of fluid and fetal movement  were reviewed in detail with the patient. Please refer to After Visit Summary for other counseling recommendations.   Return in about 1 week (around 08/30/2020) for in person, ROB, High risk.  Future Appointments  Date Time Provider Department Center  08/27/2020  9:45 AM [facility] US4 [facility]    Catalina Antigua, MD

## 2020-08-23 NOTE — Progress Notes (Signed)
ROB/GBS, reports no problems today. 

## 2020-08-23 NOTE — Patient Instructions (Signed)

## 2020-08-24 LAB — TYPE AND SCREEN
ABO/RH(D): O POS
Antibody Screen: POSITIVE

## 2020-08-24 LAB — CERVICOVAGINAL ANCILLARY ONLY
Chlamydia: NEGATIVE
Comment: NEGATIVE
Comment: NORMAL
Neisseria Gonorrhea: NEGATIVE

## 2020-08-25 LAB — STREP GP B NAA: Strep Gp B NAA: POSITIVE — AB

## 2020-08-26 ENCOUNTER — Encounter: Payer: Self-pay | Admitting: Obstetrics and Gynecology

## 2020-08-26 ENCOUNTER — Other Ambulatory Visit: Payer: Self-pay | Admitting: Women's Health

## 2020-08-26 DIAGNOSIS — O99820 Streptococcus B carrier state complicating pregnancy: Secondary | ICD-10-CM | POA: Insufficient documentation

## 2020-08-26 DIAGNOSIS — R760 Raised antibody titer: Secondary | ICD-10-CM

## 2020-08-26 DIAGNOSIS — Z3480 Encounter for supervision of other normal pregnancy, unspecified trimester: Secondary | ICD-10-CM

## 2020-08-27 ENCOUNTER — Other Ambulatory Visit: Payer: Medicaid Other

## 2020-08-30 ENCOUNTER — Ambulatory Visit (INDEPENDENT_AMBULATORY_CARE_PROVIDER_SITE_OTHER): Payer: Medicaid Other | Admitting: Obstetrics and Gynecology

## 2020-08-30 ENCOUNTER — Other Ambulatory Visit: Payer: Self-pay

## 2020-08-30 VITALS — BP 102/68 | HR 87 | Wt 160.0 lb

## 2020-08-30 DIAGNOSIS — O99820 Streptococcus B carrier state complicating pregnancy: Secondary | ICD-10-CM

## 2020-08-30 DIAGNOSIS — Z3480 Encounter for supervision of other normal pregnancy, unspecified trimester: Secondary | ICD-10-CM

## 2020-08-30 DIAGNOSIS — R760 Raised antibody titer: Secondary | ICD-10-CM

## 2020-08-30 DIAGNOSIS — Z3A37 37 weeks gestation of pregnancy: Secondary | ICD-10-CM | POA: Insufficient documentation

## 2020-08-30 NOTE — Progress Notes (Signed)
Pt was to have u/s last week- got lost going to appt and was rescheduled to 2/11.

## 2020-08-30 NOTE — Patient Instructions (Signed)

## 2020-08-30 NOTE — Progress Notes (Signed)
   PRENATAL VISIT NOTE  Subjective:  Sierra Thomas is a 30 y.o. G2P1001 at [redacted]w[redacted]d being seen today for ongoing prenatal care.  She is currently monitored for the following issues for this low-risk pregnancy and has Supervision of other normal pregnancy, antepartum; Abnormal antibody titer; GBS (group B Streptococcus carrier), +RV culture, currently pregnant; and [redacted] weeks gestation of pregnancy on their problem list.  Patient doing well with no acute concerns today. She reports no complaints.  Contractions: Not present. Vag. Bleeding: None.  Movement: Present. Denies leaking of fluid.   The following portions of the patient's history were reviewed and updated as appropriate: allergies, current medications, past family history, past medical history, past social history, past surgical history and problem list. Problem list updated.  Objective:   Vitals:   08/30/20 1038  BP: 102/68  Pulse: 87  Weight: 160 lb (72.6 kg)    Fetal Status: Fetal Heart Rate (bpm): 140   Movement: Present     General:  Alert, oriented and cooperative. Patient is in no acute distress.  Skin: Skin is warm and dry. No rash noted.   Cardiovascular: Normal heart rate noted  Respiratory: Normal respiratory effort, no problems with respiration noted  Abdomen: Soft, gravid, appropriate for gestational age.  Pain/Pressure: Absent     Pelvic: Cervical exam deferred        Extremities: Normal range of motion.     Mental Status:  Normal mood and affect. Normal behavior. Normal judgment and thought content.   Assessment and Plan:  Pregnancy: G2P1001 at [redacted]w[redacted]d  1. GBS (group B Streptococcus carrier), +RV culture, currently pregnant Treat in labor  2. Abnormal antibody titer Pt was scheduled for [place], pt missed appointment and was rescheduled for 2/11 Since date is further out, consider NST next visit  3. Supervision of other normal pregnancy, antepartum   4. [redacted] weeks gestation of pregnancy   Term labor  symptoms and general obstetric precautions including but not limited to vaginal bleeding, contractions, leaking of fluid and fetal movement were reviewed in detail with the patient.  Please refer to After Visit Summary for other counseling recommendations.   Return in about 1 week (around 09/06/2020) for [facility], in person.   Mariel Aloe, MD

## 2020-09-07 ENCOUNTER — Other Ambulatory Visit: Payer: Self-pay

## 2020-09-07 ENCOUNTER — Ambulatory Visit (INDEPENDENT_AMBULATORY_CARE_PROVIDER_SITE_OTHER): Payer: Medicaid Other | Admitting: Obstetrics and Gynecology

## 2020-09-07 VITALS — BP 103/64 | HR 74 | Wt 152.0 lb

## 2020-09-07 DIAGNOSIS — O99820 Streptococcus B carrier state complicating pregnancy: Secondary | ICD-10-CM

## 2020-09-07 DIAGNOSIS — Z3480 Encounter for supervision of other normal pregnancy, unspecified trimester: Secondary | ICD-10-CM

## 2020-09-07 DIAGNOSIS — Z3A38 38 weeks gestation of pregnancy: Secondary | ICD-10-CM | POA: Insufficient documentation

## 2020-09-07 DIAGNOSIS — R760 Raised antibody titer: Secondary | ICD-10-CM

## 2020-09-07 NOTE — Progress Notes (Signed)
[employer] interpreter [place] [phone].

## 2020-09-07 NOTE — Progress Notes (Signed)
   PRENATAL VISIT NOTE  Subjective:  Sierra Thomas is a 30 y.o. G2P1001 at [redacted]w[redacted]d being seen today for ongoing prenatal care.  She is currently monitored for the following issues for this high-risk pregnancy and has Supervision of other normal pregnancy, antepartum; Abnormal antibody titer; GBS (group B Streptococcus carrier), +RV culture, currently pregnant; [redacted] weeks gestation of pregnancy; and [redacted] weeks gestation of pregnancy on their problem list.  Patient doing well with no acute concerns today. She reports no complaints.  Contractions: Not present.  .  Movement: Present. Denies leaking of fluid.   The following portions of the patient's history were reviewed and updated as appropriate: allergies, current medications, past family history, past medical history, past social history, past surgical history and problem list. Problem list updated.  Objective:   Vitals:   09/07/20 1133  BP: 103/64  Pulse: 74  Weight: 152 lb (68.9 kg)    Fetal Status: Fetal Heart Rate (bpm): 137   Movement: Present     General:  Alert, oriented and cooperative. Patient is in no acute distress.  Skin: Skin is warm and dry. No rash noted.   Cardiovascular: Normal heart rate noted  Respiratory: Normal respiratory effort, no problems with respiration noted  Abdomen: Soft, gravid, appropriate for gestational age.  Pain/Pressure: Absent     Pelvic: Cervical exam deferred        Extremities: Normal range of motion.  Edema: None  Mental Status:  Normal mood and affect. Normal behavior. Normal judgment and thought content.   Assessment and Plan:  Pregnancy: G2P1001 at [redacted]w[redacted]d  1. [redacted] weeks gestation of pregnancy   2. Supervision of other normal pregnancy, antepartum Pt has anatomy scan scheduled for 09/10/20  3. Abnormal antibody titer Per last type and screen, non specific cold antibody, not felt to be clinically significant  4. GBS (group B Streptococcus carrier), +RV culture, currently  pregnant Treat in labor  Term labor symptoms and general obstetric precautions including but not limited to vaginal bleeding, contractions, leaking of fluid and fetal movement were reviewed in detail with the patient.  Please refer to After Visit Summary for other counseling recommendations.   Return in about 1 week (around 09/14/2020) for ROB, in person.   Mariel Aloe, MD

## 2020-09-07 NOTE — Patient Instructions (Signed)

## 2020-09-10 ENCOUNTER — Other Ambulatory Visit: Payer: Self-pay

## 2020-09-10 ENCOUNTER — Ambulatory Visit: Payer: Medicaid Other | Attending: Women's Health

## 2020-09-10 DIAGNOSIS — R760 Raised antibody titer: Secondary | ICD-10-CM | POA: Insufficient documentation

## 2020-09-10 DIAGNOSIS — Z3480 Encounter for supervision of other normal pregnancy, unspecified trimester: Secondary | ICD-10-CM | POA: Insufficient documentation

## 2020-09-14 ENCOUNTER — Other Ambulatory Visit: Payer: Self-pay | Admitting: Advanced Practice Midwife

## 2020-09-14 ENCOUNTER — Ambulatory Visit (INDEPENDENT_AMBULATORY_CARE_PROVIDER_SITE_OTHER): Payer: Medicaid Other | Admitting: Obstetrics and Gynecology

## 2020-09-14 ENCOUNTER — Other Ambulatory Visit: Payer: Self-pay

## 2020-09-14 VITALS — BP 115/73 | HR 71 | Wt 155.8 lb

## 2020-09-14 DIAGNOSIS — R760 Raised antibody titer: Secondary | ICD-10-CM

## 2020-09-14 DIAGNOSIS — Z3A39 39 weeks gestation of pregnancy: Secondary | ICD-10-CM | POA: Insufficient documentation

## 2020-09-14 DIAGNOSIS — Z3480 Encounter for supervision of other normal pregnancy, unspecified trimester: Secondary | ICD-10-CM

## 2020-09-14 DIAGNOSIS — O99820 Streptococcus B carrier state complicating pregnancy: Secondary | ICD-10-CM

## 2020-09-14 NOTE — Patient Instructions (Signed)
Labor Induction Labor induction is when steps are taken to cause a pregnant woman to begin the labor process. Most women go into labor on their own between 37 weeks and 42 weeks of pregnancy. When this does not happen, or when there is a medical need for labor to begin, steps may be taken to induce, or bring on, labor. Labor induction causes a pregnant woman's uterus to contract. It also causes the cervix to soften (ripen), open (dilate), and thin out. Usually, labor is not induced before 39 weeks of pregnancy unless there is a medical reason to do so. When is labor induction considered? Labor induction may be right for you if:  Your pregnancy lasts longer than 41 to 42 weeks.  Your placenta is separating from your uterus (placental abruption).  You have a rupture of membranes and your labor does not begin.  You have health problems, like diabetes or high blood pressure (preeclampsia) during your pregnancy.  Your baby has stopped growing or does not have enough amniotic fluid. Before labor induction begins, your health care provider will consider the following factors:  Your medical condition and the baby's condition.  How many weeks you have been pregnant.  How mature the baby's lungs are.  The condition of your cervix.  The position of the baby.  The size of your birth canal. Tell a health care provider about:  Any allergies you have.  All medicines you are taking, including vitamins, herbs, eye drops, creams, and over-the-counter medicines.  Any problems you or your family members have had with anesthetic medicines.  Any surgeries you have had.  Any blood disorders you have.  Any medical conditions you have. What are the risks? Generally, this is a safe procedure. However, problems may occur, including:  Failed induction.  Changes in fetal heart rate, such as being too high, too low, or irregular (erratic).  Infection in the mother or the baby.  Increased risk of  having a cesarean delivery.  Breaking off (abruption) of the placenta from the uterus. This is rare.  Rupture of the uterus. This is very rare.  Your baby could fail to get enough blood flow or oxygen. This can be life-threatening. When induction is needed for medical reasons, the benefits generally outweigh the risks. What happens during the procedure? During the procedure, your health care provider will use one of these methods to induce labor:  Stripping the membranes. In this method, the amniotic sac tissue is gently separated from the cervix. This causes the following to happen: ? Your cervix stretches, which in turn causes the release of prostaglandins. ? Prostaglandins induce labor and cause the uterus to contract. ? This procedure is often done in an office visit. You will be sent home to wait for contractions to begin.  Prostaglandin medicine. This medicine starts contractions and causes the cervix to dilate and ripen. This can be taken by mouth (orally) or by being inserted into the vagina (suppository).  Inserting a small, thin tube (catheter) with a balloon into the vagina and then expanding the balloon with water to dilate the cervix.  Breaking the water. In this method, a small instrument is used to make a small hole in the amniotic sac. This eventually causes the amniotic sac to break. Contractions should begin within a few hours.  Medicine to trigger or strengthen contractions. This medicine is given through an IV that is inserted into a vein in your arm. This procedure may vary among health care providers and hospitals.     Where to find more information  March of D[employer]: www.marchofdimes.org  The American College of Obstetricians and Gynecologists: www.acog.org Summary  Labor induction causes a pregnant woman's uterus to contract. It also causes the cervix to soften (ripen), open (dilate), and thin out.  Labor is usually not induced before 39 weeks of pregnancy unless  there is a medical reason to do so.  When induction is needed for medical reasons, the benefits generally outweigh the risks.  Talk with your health care provider about which methods of labor induction are right for you. This information is not intended to replace advice given to you by your health care provider. Make sure you discuss any questions you have with your health care provider. Document Revised: 04/29/2020 Document Reviewed: 04/29/2020 Elsevier Patient Education  2021 [employer] Inc.  

## 2020-09-14 NOTE — Progress Notes (Signed)
   PRENATAL VISIT NOTE  Subjective:  Sierra Thomas is a 30 y.o. G2P1001 at [redacted]w[redacted]d being seen today for ongoing prenatal care.  She is currently monitored for the following issues for this high-risk pregnancy and has Supervision of other normal pregnancy, antepartum; Abnormal antibody titer; GBS (group B Streptococcus carrier), +RV culture, currently pregnant; [redacted] weeks gestation of pregnancy; [redacted] weeks gestation of pregnancy; and [redacted] weeks gestation of pregnancy on their problem list.  Patient doing well with no acute concerns today. She reports no complaints.  Contractions: Not present. Vag. Bleeding: None.  Movement: Present. Denies leaking of fluid.   The following portions of the patient's history were reviewed and updated as appropriate: allergies, current medications, past family history, past medical history, past social history, past surgical history and problem list. Problem list updated.  Objective:   Vitals:   09/14/20 1152  BP: 115/73  Pulse: 71  Weight: 155 lb 12.8 oz (70.7 kg)    Fetal Status: Fetal Heart Rate (bpm): 126   Movement: Present     General:  Alert, oriented and cooperative. Patient is in no acute distress.  Skin: Skin is warm and dry. No rash noted.   Cardiovascular: Normal heart rate noted  Respiratory: Normal respiratory effort, no problems with respiration noted  Abdomen: Soft, gravid, appropriate for gestational age.  Pain/Pressure: Absent     Pelvic: Cervical exam deferred        Extremities: Normal range of motion.  Edema: None  Mental Status:  Normal mood and affect. Normal behavior. Normal judgment and thought content.   Assessment and Plan:  Pregnancy: G2P1001 at [redacted]w[redacted]d  1. [redacted] weeks gestation of pregnancy Discussed IOL in detail, pt somewhat reluctant but agrees on 40.4 weeks  2. GBS (group B Streptococcus carrier), +RV culture, currently pregnant Treat in labor  3. Abnormal antibody titer   4. Supervision of other normal pregnancy,  antepartum IOL on 09/22/20, NST next week for postdates  Term labor symptoms and general obstetric precautions including but not limited to vaginal bleeding, contractions, leaking of fluid and fetal movement were reviewed in detail with the patient.  Please refer to After Visit Summary for other counseling recommendations.   Return in about 1 week (around 09/21/2020) for ROB, in person, with NST.   Mariel Aloe, MD Faculty Attending Center for [facility]

## 2020-09-15 ENCOUNTER — Telehealth (HOSPITAL_COMMUNITY): Payer: Self-pay | Admitting: *Deleted

## 2020-09-15 ENCOUNTER — Other Ambulatory Visit: Payer: Self-pay | Admitting: Advanced Practice Midwife

## 2020-09-15 NOTE — Telephone Encounter (Signed)
Preadmission screen Interpreter number [phone]

## 2020-09-16 ENCOUNTER — Other Ambulatory Visit (INDEPENDENT_AMBULATORY_CARE_PROVIDER_SITE_OTHER): Payer: Medicaid Other | Admitting: Obstetrics and Gynecology

## 2020-09-17 ENCOUNTER — Encounter (HOSPITAL_COMMUNITY): Payer: Self-pay | Admitting: *Deleted

## 2020-09-17 ENCOUNTER — Telehealth (HOSPITAL_COMMUNITY): Payer: Self-pay | Admitting: *Deleted

## 2020-09-17 NOTE — Telephone Encounter (Signed)
Preadmission screen Interpreter number [phone]

## 2020-09-18 ENCOUNTER — Other Ambulatory Visit: Payer: Self-pay | Admitting: Advanced Practice Midwife

## 2020-09-20 ENCOUNTER — Other Ambulatory Visit (HOSPITAL_COMMUNITY): Payer: Medicaid Other | Attending: Family Medicine

## 2020-09-21 ENCOUNTER — Encounter: Payer: Medicaid Other | Admitting: Obstetrics and Gynecology

## 2020-09-22 ENCOUNTER — Other Ambulatory Visit: Payer: Self-pay | Admitting: Advanced Practice Midwife

## 2020-09-22 ENCOUNTER — Inpatient Hospital Stay (HOSPITAL_COMMUNITY): Payer: Medicaid Other | Attending: Obstetrics and Gynecology

## 2020-09-22 ENCOUNTER — Inpatient Hospital Stay (HOSPITAL_COMMUNITY)
Admission: AD | Admit: 2020-09-22 | Payer: Medicaid Other | Source: Home / Self Care | Admitting: Obstetrics and Gynecology

## 2020-09-23 ENCOUNTER — Other Ambulatory Visit: Payer: Self-pay | Admitting: Obstetrics and Gynecology

## 2020-09-23 DIAGNOSIS — Z3480 Encounter for supervision of other normal pregnancy, unspecified trimester: Secondary | ICD-10-CM

## 2020-09-24 ENCOUNTER — Other Ambulatory Visit: Payer: Medicaid Other

## 2020-09-24 ENCOUNTER — Other Ambulatory Visit: Payer: Self-pay

## 2020-09-24 ENCOUNTER — Encounter (HOSPITAL_COMMUNITY): Payer: Self-pay | Admitting: Obstetrics & Gynecology

## 2020-09-24 ENCOUNTER — Inpatient Hospital Stay (HOSPITAL_COMMUNITY)
Admission: AD | Admit: 2020-09-24 | Discharge: 2020-09-26 | DRG: 807 | Disposition: A | Discharge status: Home / Self Care | Payer: Medicaid Other | Attending: Obstetrics and Gynecology | Admitting: Obstetrics and Gynecology

## 2020-09-24 DIAGNOSIS — O99824 Streptococcus B carrier state complicating childbirth: Secondary | ICD-10-CM | POA: Diagnosis present

## 2020-09-24 DIAGNOSIS — Z20822 Contact with and (suspected) exposure to covid-19: Secondary | ICD-10-CM | POA: Diagnosis present

## 2020-09-24 DIAGNOSIS — Z3A40 40 weeks gestation of pregnancy: Secondary | ICD-10-CM | POA: Diagnosis not present

## 2020-09-24 DIAGNOSIS — O480 Post-term pregnancy: Secondary | ICD-10-CM | POA: Diagnosis present

## 2020-09-24 DIAGNOSIS — O99820 Streptococcus B carrier state complicating pregnancy: Secondary | ICD-10-CM

## 2020-09-24 DIAGNOSIS — Z3480 Encounter for supervision of other normal pregnancy, unspecified trimester: Secondary | ICD-10-CM

## 2020-09-24 DIAGNOSIS — R760 Raised antibody titer: Secondary | ICD-10-CM | POA: Diagnosis present

## 2020-09-24 DIAGNOSIS — O479 False labor, unspecified: Secondary | ICD-10-CM

## 2020-09-24 LAB — CBC
HCT: 37.6 % (ref 36.0–46.0)
Hemoglobin: 12.5 g/dL (ref 12.0–15.0)
MCH: 31.3 pg (ref 26.0–34.0)
MCHC: 33.2 g/dL (ref 30.0–36.0)
MCV: 94.2 fL (ref 80.0–100.0)
Platelets: 176 10*3/uL (ref 150–400)
RBC: 3.99 MIL/uL (ref 3.87–5.11)
RDW: 13.6 % (ref 11.5–15.5)
WBC: 6.5 10*3/uL (ref 4.0–10.5)
nRBC: 0 % (ref 0.0–0.2)

## 2020-09-24 LAB — RPR: RPR Ser Ql: NONREACTIVE

## 2020-09-24 LAB — RESP PANEL BY RT-PCR (FLU A&B, COVID) ARPGX2
Influenza A by PCR: NEGATIVE
Influenza B by PCR: NEGATIVE
SARS Coronavirus 2 by RT PCR: NEGATIVE

## 2020-09-24 MED ORDER — LIDOCAINE HCL (PF) 1 % IJ SOLN: 30.0000 mL | INTRAMUSCULAR | Status: DC | PRN

## 2020-09-24 MED ORDER — OXYTOCIN BOLUS FROM INFUSION
333.0000 mL | Freq: Once | INTRAVENOUS | Status: AC
  Administered 2020-09-24: 333 mL via INTRAVENOUS

## 2020-09-24 MED ORDER — ONDANSETRON HCL 4 MG/2ML IJ SOLN: 4.0000 mg | Freq: Four times a day (QID) | INTRAMUSCULAR | Status: DC | PRN

## 2020-09-24 MED ORDER — SODIUM CHLORIDE 0.9 % IV SOLN
5.0000 10*6.[IU] | Freq: Once | INTRAVENOUS | Status: AC
  Administered 2020-09-24: 5 10*6.[IU] via INTRAVENOUS
  Filled 2020-09-24: qty 5

## 2020-09-24 MED ORDER — ACETAMINOPHEN 325 MG PO TABS: 650.0000 mg | ORAL_TABLET | ORAL | Status: DC | PRN

## 2020-09-24 MED ORDER — OXYTOCIN-SODIUM CHLORIDE 30-0.9 UT/500ML-% IV SOLN
2.5000 [IU]/h | INTRAVENOUS | Status: DC
  Administered 2020-09-24: 2.5 [IU]/h via INTRAVENOUS

## 2020-09-24 MED ORDER — OXYCODONE-ACETAMINOPHEN 5-325 MG PO TABS: 1.0000 | ORAL_TABLET | ORAL | Status: DC | PRN

## 2020-09-24 MED ORDER — LACTATED RINGERS IV SOLN: INTRAVENOUS | Status: DC

## 2020-09-24 MED ORDER — HYDROXYZINE HCL 50 MG PO TABS: 50.0000 mg | ORAL_TABLET | Freq: Four times a day (QID) | ORAL | Status: DC | PRN

## 2020-09-24 MED ORDER — TERBUTALINE SULFATE 1 MG/ML IJ SOLN: 0.2500 mg | Freq: Once | INTRAMUSCULAR | Status: DC | PRN

## 2020-09-24 MED ORDER — MISOPROSTOL 25 MCG QUARTER TABLET: 25.0000 ug | ORAL_TABLET | ORAL | Status: DC | PRN

## 2020-09-24 MED ORDER — PENICILLIN G POT IN DEXTROSE 60000 UNIT/ML IV SOLN
3.0000 10*6.[IU] | INTRAVENOUS | Status: DC
  Administered 2020-09-24 (×3): 3 10*6.[IU] via INTRAVENOUS
  Filled 2020-09-24 (×3): qty 50

## 2020-09-24 MED ORDER — FENTANYL CITRATE (PF) 100 MCG/2ML IJ SOLN: 50.0000 ug | INTRAMUSCULAR | Status: DC | PRN

## 2020-09-24 MED ORDER — SOD CITRATE-CITRIC ACID 500-334 MG/5ML PO SOLN: 30.0000 mL | ORAL | Status: DC | PRN

## 2020-09-24 MED ORDER — OXYTOCIN-SODIUM CHLORIDE 30-0.9 UT/500ML-% IV SOLN
1.0000 m[IU]/min | INTRAVENOUS | Status: DC
  Filled 2020-09-24: qty 500

## 2020-09-24 MED ORDER — OXYTOCIN-SODIUM CHLORIDE 30-0.9 UT/500ML-% IV SOLN
1.0000 m[IU]/min | INTRAVENOUS | Status: DC
  Administered 2020-09-24: 2 m[IU]/min via INTRAVENOUS

## 2020-09-24 MED ORDER — LACTATED RINGERS IV SOLN: 500.0000 mL | INTRAVENOUS | Status: DC | PRN

## 2020-09-24 NOTE — MAU Provider Note (Signed)
Pt informed that the ultrasound is considered a limited OB ultrasound and is not intended to be a complete ultrasound exam.  Patient also informed that the ultrasound is not being completed with the intent of assessing for fetal or placental anomalies or any pelvic abnormalities.  Explained that the purpose of today's ultrasound is to assess for presentation  Patient acknowledges the purpose of the exam and the limitations of the study.    Fetus is in vertex presentation

## 2020-09-24 NOTE — Progress Notes (Signed)
Labor Progress Note Sierra Thomas is a 30 y.o. G2P1001 at [redacted]w[redacted]d presented for post-dates IOL  S:  Pt resting in bed, has not been amenable to getting up to walk or many position changes. Contractions stronger, pt very uncomfortable but declining pain meds. Planning to get up to shower.  O:  BP 112/61   Pulse 75   Temp 97.7 F (36.5 C) (Oral)   Resp 18   Ht 5\' 1"  (1.549 m)   Wt 155 lb 13.8 oz (70.7 kg)   LMP 12/13/2019   BMI 29.45 kg/m  EFM: baseline 135 bpm/ moderate variability/ + accels/ some early (but variable) decels  Toco/IUPC: q36min SVE: Dilation: 5.5 Effacement (%): 70 Station: -3 Presentation: Vertex Exam by:: K Faucett RN Pitocin: 10 mu/min (backing down to 7mu/min)  A/P: 30 y.o. G2P1001 [redacted]w[redacted]d  1. Labor: active 2. FWB: Cat 1 3. Pain: still considered manageable by pt, declining meds 4. Will decrease Pitocin while pt up in shower. If ambulation and standing help engagement of baby, will AROM and titrate Pitocin up as tolerated  Anticipate SVD.  [redacted]w[redacted]d, CNM 6:09 PM

## 2020-09-24 NOTE — Progress Notes (Addendum)
Labor Progress Note Sierra Thomas is a 30 y.o. G2P1001 at [redacted]w[redacted]d presented for post-dates IOL. Having contractions on admission, declined additional interventions.  S:  Pt resting in bed, still having contractions which are are stronger and more regular. Would like to wait an hour to see if contraction pattern improves but is then amenable to Pitocin.  O:  BP 107/63   Pulse 73   Temp 97.7 F (36.5 C) (Oral)   Resp 18   Ht 5\' 1"  (1.549 m)   Wt 155 lb 13.8 oz (70.7 kg)   LMP 12/13/2019   BMI 29.45 kg/m  EFM: baseline 135 bpm/ moderate variability/ + accels/ no decels  Toco/IUPC: q73min SVE: Dilation: 4 Effacement (%): 60 Station: -3 Presentation: Vertex Exam by:: K Faucett RN Pitocin: None yet  A/P: 30 y.o. G2P1001 [redacted]w[redacted]d  1. Labor: latent 2. FWB: cat 1 3. Pain: well controlled with deep breathing and position changes 4. RN to try breast pump and walking with pt  Anticipate SVD.  [redacted]w[redacted]d, CNM, MSN, IBCLC Certified Nurse Midwife, [facility]

## 2020-09-24 NOTE — Discharge Summary (Signed)
Postpartum Discharge Summary  Date of Service updated 09/26/20     Patient Name: Sierra Thomas DOB: [birth year] MRN: [id number]  Date of admission: 09/24/2020 Delivery date:09/24/2020  Delivering provider: Fatima Blank A  Date of discharge: 09/26/2020  Admitting diagnosis: Post term pregnancy over 40 weeks [O48.0] Intrauterine pregnancy: [redacted]w[redacted]d    Secondary diagnosis:  Active Problems:   Supervision of other normal pregnancy, antepartum   Abnormal antibody titer   GBS (group B Streptococcus carrier), +RV culture, currently pregnant   Post term pregnancy over 40 weeks   Vaginal delivery  Additional problems: none    Discharge diagnosis: Term Pregnancy Delivered                                              Post partum procedures:none Augmentation: Pitocin Complications: None  Hospital course: Onset of Labor With Vaginal Delivery      30y.o. yo G2P1001 at 414w6das admitted in Latent Labor on 09/24/2020. Patient had an uncomplicated labor course as follows:  Membrane Rupture Time/Date: 8:56 PM ,09/24/2020   Delivery Method:Vaginal, Spontaneous  Episiotomy: None  Lacerations:  None    Pt pushed through 3 contractions to deliver, loose nuchal cord, infant delivered through, infant with slow transition immediately at delivery and clamped and cut by CNM and taken to warmer.  NICU team paged to room. Pt with improved transition, 5 minute Apgar of 8 and remained in room with patient. See delivery summary for details.    Patient had an uncomplicated postpartum course.  She is ambulating, tolerating a regular diet, passing flatus, and urinating well. Patient is discharged home in stable condition on 09/26/20.  Newborn Data: Birth date:09/24/2020  Birth time:9:03 PM  Gender:Female  Living status:Living  Apgars:5 ,8  Weight:3481 g   Magnesium Sulfate received: No BMZ received: No Rhophylac:No MMR:No T-DaP:Given prenatally Flu: No Transfusion:No  Physical exam   Vitals:   09/25/20 1113 09/25/20 1503 09/25/20 2202 09/26/20 0530  BP: 122/76 99/63 107/68 100/62  Pulse: 82 85 78 66  Resp: _0 Temp: 98.2 F (36.8 C) 97.8 F (36.6 C) 97.8 F (36.6 C) 97.7 F (36.5 C)  TempSrc: Oral Oral Oral Oral  SpO2: 100% 99%  100%  Weight:      Height:       General: alert, cooperative and no distress Lochia: appropriate Uterine Fundus: firm Incision: N/A DVT Evaluation: No evidence of DVT seen on physical exam. Labs: Lab Results  Component Value Date   WBC 8.3 09/25/2020   HGB 11.8 (L) 09/25/2020   HCT 33.0 (L) 09/25/2020   MCV 90.9 09/25/2020   PLT 207 09/25/2020   No flowsheet data found. Edinburgh Score: Edinburgh Postnatal Depression Scale Screening Tool 09/25/2020  I have been able to laugh and see the funny side of things. 0  I have looked forward with enjoyment to things. 0  I have blamed myself unnecessarily when things went wrong. 0  I have been anxious or worried for no good reason. 0  I have felt scared or panicky for no good reason. 0  Things have been getting on top of me. 0  I have been so unhappy that I have had difficulty sleeping. 0  I have felt sad or miserable. 0  I have been so unhappy that I have been crying. 0  The thought  of harming myself has occurred to me. 0  Edinburgh Postnatal Depression Scale Total 0     After visit meds:  Allergies as of 09/26/2020   No Known Allergies     Medication List    TAKE these medications   acetaminophen 500 MG tablet Commonly known as: TYLENOL Take 2 tablets (1,000 mg total) by mouth every 6 (six) hours as needed for headache.   Blood Pressure Kit Devi Check BP readings regularly at home   coconut oil Oil Apply 1 application topically as needed.   Gojji Weight Scale Misc 1 Device by Does not apply route as needed.   ibuprofen 600 MG tablet Commonly known as: ADVIL Take 1 tablet (600 mg total) by mouth every 6 (six) hours as needed.   PrePLUS 27-1 MG  Tabs Take 1 tablet by mouth daily.        Discharge home in stable condition Infant Feeding: Breast Infant Disposition:home with mother Discharge instruction: per After Visit Summary and Postpartum booklet. Activity: Advance as tolerated. Pelvic rest for 6 weeks.  Diet: routine diet Future Appointments: Future Appointments  Date Time Provider Department Center  09/27/2020  4:15 PM Constant, Peggy, MD [facility] None   Follow up Visit:  Message sent to [facility] on 09/24/20:  Please schedule this patient for a In person postpartum visit in 4 weeks with the following provider: Any provider. Additional Postpartum F/U:n/a  Low risk pregnancy complicated by: n/a Delivery mode:  Vaginal, Spontaneous  Anticipated Birth Control:  Unsure--discuss at postpartum visit   09/26/2020 Alicia C Firestone, MD   

## 2020-09-24 NOTE — MAU Note (Signed)
..  Sierra Thomas is a 30 y.o. at [redacted]w[redacted]d here in MAU reporting: contractions for 2 hours that come and go but has not been able to time them. Patient states she had an appointment 2 days ago for an induction but "woke up late". +FM. Reports some bloody show.

## 2020-09-24 NOTE — Progress Notes (Signed)
Pt educated on the purpose and use of pitocin using an interpreter. Pt wishes to hold of on pitocin to see if her body progresses on it's own.

## 2020-09-24 NOTE — H&P (Addendum)
OBSTETRIC ADMISSION HISTORY AND PHYSICAL  Sierra Thomas is a 30 y.o. female G2P1001 with IUP at 53w6dby 27week ultrasound presenting for postdates IOL, latent labor. She reports +FMs, No LOF, no VB, no blurry vision, headaches or peripheral edema, and RUQ pain.  She plans on breast and bottle feeding. She is undecided for birth control.  [facility]  Dating: By LMP --->  Estimated Date of Delivery: 09/18/20  Sono:  _0 , CWD, normal anatomy, cephalic presentation, [id number] 81% EFW   Prenatal History/Complications: late PNC (27weeks), positive antibody titer, language barrier (Swahili)  Past Medical History: Past Medical History:  Diagnosis Date   Anemia    Anemia 01/10/2019   CBC Latest Ref Rng & Units 06/21/2020 01/11/2019 01/10/2019 WBC 3.4 - 10.8 x10E3/uL 4.4 9.6 6.9 Hemoglobin 11.1 - 15.9 g/dL 12.0 10.7(L) 11.8(L) Hematocrit 34.0 - 46.6 % 34.5 32.7(L) 35.6(L) Platelets 150 - 450 x10E3/uL 150 228 220    Medical history non-contributory     Past Surgical History: Past Surgical History:  Procedure Laterality Date   NO PAST SURGERIES      Obstetrical History: OB History     Gravida  2   Para  1   Term  1   Preterm      AB      Living  1      SAB      IAB      Ectopic      Multiple  0   Live Births  1           Social History Social History   Socioeconomic History   Marital status: Married    Spouse name: Not on file   Number of children: Not on file   Years of education: Not on file   Highest education level: Not on file  Occupational History   Not on file  Tobacco Use   Smoking status: Never Smoker   Smokeless tobacco: Never Used  Vaping Use   Vaping Use: Never used  Substance and Sexual Activity   Alcohol use: Never   Drug use: Never   Sexual activity: Yes    Partners: Male  Other Topics Concern   Not on file  Social History Narrative   Works at [employer] Education: high sPrintmaker     Social Determinants of Health   Financial Resource Strain: Not on file  Food Insecurity: Not on file  Transportation Needs: Not on file  Physical Activity: Not on file  Stress: Not on file  Social Connections: Not on file    Family History: Family History  Problem Relation Age of Onset   Healthy Mother    Healthy Father     Allergies: No Known Allergies  Medications Prior to Admission  Medication Sig Dispense Refill Last Dose   Prenatal Vit-Fe Fumarate-FA (PREPLUS) 27-1 MG TABS Take 1 tablet by mouth daily. 30 tablet 13 09/23/2020 at Unknown time   acetaminophen (TYLENOL) 500 MG tablet Take 2 tablets (1,000 mg total) by mouth every 6 (six) hours as needed for headache. (Patient not taking: Reported on 09/14/2020) 30 tablet 0    Blood Pressure Monitoring (BLOOD PRESSURE KIT) DEVI Check BP readings regularly at home (Patient not taking: No sig reported) 1 each 0    Misc. Devices (GOJJI WEIGHT SCALE) MISC 1 Device by Does not apply route as needed. (Patient not taking: No sig reported) 1 each 0      Review of Systems  All systems reviewed and negative except as stated in HPI  Last menstrual period 12/13/2019, unknown if currently breastfeeding. General appearance: alert, cooperative and no distress Lungs: clear to auscultation bilaterally Heart: regular rate and rhythm Abdomen: soft, non-tender; bowel sounds normal Extremities: no sign of DVT Presentation: cephalic by ultrasound in MAU Fetal monitoringBaseline: 135 bpm, Variability: Good {> 6 bpm), Accelerations: Reactive and Decelerations: Absent Uterine activityFrequency: Every 3-7 minutes Dilation: 2 Effacement (%): 50 Station: Ballotable Exam by:: Sierra Doyne, RN   Prenatal labs: ABO, Rh: --/--/O POS (01/04 1120) Antibody: POS (01/04 1120) Rubella: 6.66 (11/22 1006) RPR: Non Reactive (11/22 1006)  HBsAg: Negative (11/22 1006)  HIV: Non Reactive (11/22 1006)  GBS: Positive/-- (01/24 1113)  GTT:  normal Genetic screening  LR NIPS, AFP normal Anatomy US normal  Prenatal Transfer Tool  Maternal Diabetes: No Genetic Screening: Normal Maternal Ultrasounds/Referrals: Normal Fetal Ultrasounds or other Referrals:  None Maternal Substance Abuse:  No Significant Maternal Medications:  None Significant Maternal Lab Results: Other: abnormal antibody screen  No results found for this or any previous visit (from the past 24 hour(s)).  Patient Active Problem List   Diagnosis Date Noted   GBS (group B Streptococcus carrier), +RV culture, currently pregnant 08/26/2020   Abnormal antibody titer 07/19/2020   Supervision of other normal pregnancy, antepartum 06/21/2020    Assessment/Plan:  Sierra Thomas is a 30 y.o. G2P1001 at 69w6dhere for postdates IOL, latent labor. Prenatal course complicated by late [place](27 weeks) and positive antibody titer.  #Post-dates IOL in latent labor: Cervix favorable. Offered pitocin, patient prefers to hold off at this time and see if she progresses naturally. Will re-assess at next cervical exam. #Pain: Prn per request, desires natural delivery #FWB: Cat I strip #ID: GBS+, initiate PCN on admission #MOF: Breast/Bottle #MOC: Undecided #Circ: Yes #[facility] #Language barrier: Swahili interpreter used  AAlcus Dad MD  09/24/2020, 4:38 AM  Attestation of Supervision of Student:  I confirm that I have verified the information documented in the  resident's  note and that I have also personally reperformed the history, physical exam and all medical decision making activities.  I have verified that all services and findings are accurately documented in this student's note; and I agree with management and plan as outlined in the documentation. I have also made any necessary editorial changes.  ARanda Ngo MBereafor [facility] 09/24/2020  7:15 AM

## 2020-09-25 LAB — CBC
HCT: 33 % — ABNORMAL LOW (ref 36.0–46.0)
Hemoglobin: 11.8 g/dL — ABNORMAL LOW (ref 12.0–15.0)
MCH: 32.5 pg (ref 26.0–34.0)
MCHC: 35.8 g/dL (ref 30.0–36.0)
MCV: 90.9 fL (ref 80.0–100.0)
Platelets: 207 10*3/uL (ref 150–400)
RBC: 3.63 MIL/uL — ABNORMAL LOW (ref 3.87–5.11)
RDW: 13.7 % (ref 11.5–15.5)
WBC: 8.3 10*3/uL (ref 4.0–10.5)
nRBC: 0 % (ref 0.0–0.2)

## 2020-09-25 MED ORDER — BENZOCAINE-MENTHOL 20-0.5 % EX AERO: 1.0000 "application " | INHALATION_SPRAY | CUTANEOUS | Status: DC | PRN

## 2020-09-25 MED ORDER — COCONUT OIL OIL: 1.0000 "application " | TOPICAL_OIL | Status: DC | PRN

## 2020-09-25 MED ORDER — DIBUCAINE (PERIANAL) 1 % EX OINT: 1.0000 "application " | TOPICAL_OINTMENT | CUTANEOUS | Status: DC | PRN

## 2020-09-25 MED ORDER — ONDANSETRON HCL 4 MG/2ML IJ SOLN: 4.0000 mg | INTRAMUSCULAR | Status: DC | PRN

## 2020-09-25 MED ORDER — SENNOSIDES-DOCUSATE SODIUM 8.6-50 MG PO TABS
2.0000 | ORAL_TABLET | Freq: Every day | ORAL | Status: DC
  Administered 2020-09-26: 2 via ORAL
  Filled 2020-09-25: qty 2

## 2020-09-25 MED ORDER — ONDANSETRON HCL 4 MG PO TABS
4.0000 mg | ORAL_TABLET | ORAL | Status: DC | PRN
Start: 2020-09-25 — End: 2020-09-26

## 2020-09-25 MED ORDER — SIMETHICONE 80 MG PO CHEW
80.0000 mg | CHEWABLE_TABLET | ORAL | Status: DC | PRN
Start: 2020-09-25 — End: 2020-09-26

## 2020-09-25 MED ORDER — TETANUS-DIPHTH-ACELL PERTUSSIS 5-2.5-18.5 LF-MCG/0.5 IM SUSY: 0.5000 mL | PREFILLED_SYRINGE | Freq: Once | INTRAMUSCULAR | Status: DC

## 2020-09-25 MED ORDER — IBUPROFEN 600 MG PO TABS
600.0000 mg | ORAL_TABLET | Freq: Four times a day (QID) | ORAL | Status: DC
  Administered 2020-09-25 – 2020-09-26 (×6): 600 mg via ORAL
  Filled 2020-09-25 (×6): qty 1

## 2020-09-25 MED ORDER — WITCH HAZEL-GLYCERIN EX PADS: 1.0000 "application " | MEDICATED_PAD | CUTANEOUS | Status: DC | PRN

## 2020-09-25 MED ORDER — PRENATAL MULTIVITAMIN CH
1.0000 | ORAL_TABLET | Freq: Every day | ORAL | Status: DC
  Administered 2020-09-25 – 2020-09-26 (×2): 1 via ORAL
  Filled 2020-09-25 (×2): qty 1

## 2020-09-25 MED ORDER — ZOLPIDEM TARTRATE 5 MG PO TABS
5.0000 mg | ORAL_TABLET | Freq: Every evening | ORAL | Status: DC | PRN
Start: 2020-09-25 — End: 2020-09-26

## 2020-09-25 MED ORDER — ACETAMINOPHEN 325 MG PO TABS
650.0000 mg | ORAL_TABLET | ORAL | Status: DC | PRN
Start: 2020-09-25 — End: 2020-09-26
  Administered 2020-09-25: 650 mg via ORAL
  Filled 2020-09-25: qty 2

## 2020-09-25 MED ORDER — DIPHENHYDRAMINE HCL 25 MG PO CAPS
25.0000 mg | ORAL_CAPSULE | Freq: Four times a day (QID) | ORAL | Status: DC | PRN
Start: 2020-09-25 — End: 2020-09-26

## 2020-09-25 NOTE — Lactation Note (Signed)
This note was copied from a baby's chart. Lactation Consultation Note  Patient Name: Boy Shanae Luo NGEXB'M Date: 09/25/2020 Reason for consult: Initial assessment Age:31 hours  Initial visit to 4 hours old infant of a P2 mother with breastfeeding experience. Baby is sleeping in basinet upon arrival. Mother states breastfeeding is going well and does not reports any problems at this point.   Reviewed with mother average size of a NB stomach. Encourage to follow babies' hunger and fullness cues. Reviewed importance to offer the breast 8 to 12 times in a 24-hour period for proper stimulation and to establish good milk supply.     Reviewed breastfeeding basics. Discussed milk coming to volume. Reviewed newborn behavior and expectations with mother. Promoted maternal self care as hydration, nutrition and rest. Encouraged to contact [facility] for support, questions or concerns.    All questions answered at this time. Provided LC services brochure and promoted INJoy booklet.   Maternal Data Has patient been taught Hand Expression?: No Does the patient have breastfeeding experience prior to this delivery?: Yes How long did the patient breastfeed?: ~14 months  Feeding Mother's Current Feeding Choice: Breast Milk  LATCH Score Latch: Grasps breast easily, tongue down, lips flanged, rhythmical sucking.  Audible Swallowing: A few with stimulation  Type of Nipple: Everted at rest and after stimulation  Comfort (Breast/Nipple): Soft / non-tender  Hold (Positioning): No assistance needed to correctly position infant at breast.  LATCH Score: 9  Interventions Interventions: Breast feeding basics reviewed;Skin to skin;Expressed milk;Education  Discharge WIC Program: Yes  Consult Status Consult Status: Follow-up Date: 09/25/20 Follow-up type: In-patient    Bacilio Abascal A Higuera Ancidey 09/25/2020, 2:02 AM

## 2020-09-25 NOTE — Plan of Care (Signed)
  Problem: Activity: Goal: Risk for activity intolerance will decrease Outcome: Progressing   Problem: Nutrition: Goal: Adequate nutrition will be maintained Outcome: Progressing   Problem: Elimination: Goal: Will not experience complications related to bowel motility Outcome: Progressing Goal: Will not experience complications related to urinary retention Outcome: Progressing   Problem: Pain Managment: Goal: General experience of comfort will improve Outcome: Progressing   Problem: Education: Goal: Knowledge of condition will improve Outcome: Progressing   Problem: Activity: Goal: Will verbalize the importance of balancing activity with adequate rest periods Outcome: Progressing Goal: Ability to tolerate increased activity will improve Outcome: Progressing   Problem: Role Relationship: Goal: Ability to demonstrate positive interaction with newborn will improve Outcome: Progressing

## 2020-09-25 NOTE — Progress Notes (Signed)
Post Partum Day 1 Subjective: no complaints, up ad lib, voiding and tolerating PO  Objective: Blood pressure (!) 92/48, pulse 63, temperature 97.8 F (36.6 C), temperature source Oral, resp. rate 16, height 5\' 1"  (1.549 m), weight 70.7 kg, last menstrual period 12/13/2019, SpO2 100 %, unknown if currently breastfeeding.  Physical Exam:  General: alert, cooperative and no distress Lochia: appropriate Uterine Fundus: firm Incision: n/a DVT Evaluation: No evidence of DVT seen on physical exam.  Recent Labs    09/24/20 0510  HGB 12.5  HCT 37.6    Assessment/Plan: Plan for discharge tomorrow, Breastfeeding, Circumcision prior to discharge and Contraception undecided   LOS: 1 day   09/26/20 09/25/2020, 5:12 AM

## 2020-09-25 NOTE — Plan of Care (Signed)
  Problem: Activity: Goal: Risk for activity intolerance will decrease Outcome: Progressing   Problem: Nutrition: Goal: Adequate nutrition will be maintained Outcome: Progressing   Problem: Elimination: Goal: Will not experience complications related to bowel motility Outcome: Progressing Goal: Will not experience complications related to urinary retention Outcome: Progressing   Problem: Pain Managment: Goal: General experience of comfort will improve Outcome: Progressing   Problem: Education: Goal: Knowledge of condition will improve Outcome: Progressing   Problem: Activity: Goal: Will verbalize the importance of balancing activity with adequate rest periods Outcome: Progressing Goal: Ability to tolerate increased activity will improve Outcome: Progressing   Problem: Role Relationship: Goal: Ability to demonstrate positive interaction with newborn will improve Outcome: Progressing   

## 2020-09-25 NOTE — Progress Notes (Signed)
Post Partum Day 1 Subjective: no complaints, up ad lib and voiding  Patient sleeping comfortably, infant in basinet at bedside.  Denies difficulty breathing, respiratory distress, chest pain, and leg swelling or pain.  Objective: Blood pressure (!) 92/48, pulse 63, temperature 97.8 F (36.6 C), temperature source Oral, resp. rate 16, height 5\' 1"  (1.549 m), weight 70.7 kg, last menstrual period 12/13/2019, SpO2 100 %, unknown if currently breastfeeding.  Physical Exam:  General: alert, cooperative and no distress Lochia: appropriate Uterine Fundus: firm Incision: None DVT Evaluation: No evidence of DVT seen on physical exam. Negative Homan's sign. No cords or calf tenderness. No significant calf/ankle edema.  Recent Labs    09/24/20 0510 09/25/20 0519  HGB 12.5 11.8*  HCT 37.6 33.0*    Assessment/Plan: Plan for discharge tomorrow, Breastfeeding, Circumcision prior to discharge and Contraception Undecided   09/27/20, Juliann Pares 09/25/2020, 8:04 AM

## 2020-09-25 NOTE — Lactation Note (Signed)
This note was copied from a baby's chart. Lactation Consultation Note  Patient Name: Sierra Thomas PQDIY'M Date: 09/25/2020 Reason for consult: Follow-up assessment Age:31 hours   LC Follow Up Visit:  Attempted to visit with mother, however, she was asleep.     Maternal Data    Feeding    LATCH Score                    Lactation Tools Discussed/Used    Interventions    Discharge    Consult Status Consult Status: Follow-up Date: 09/26/20 Follow-up type: In-patient    Dora Sims 09/25/2020, 5:46 PM

## 2020-09-26 MED ORDER — COCONUT OIL OIL: 1.0000 "application " | TOPICAL_OIL | 0 refills | Status: DC | PRN

## 2020-09-26 MED ORDER — IBUPROFEN 600 MG PO TABS: 600.0000 mg | ORAL_TABLET | Freq: Four times a day (QID) | ORAL | 0 refills | Status: DC | PRN

## 2020-09-26 NOTE — Plan of Care (Signed)
  Problem: Education: Goal: Knowledge of Childbirth will improve Outcome: Completed/Met Goal: Ability to make informed decisions regarding treatment and plan of care will improve Outcome: Completed/Met Goal: Ability to state and carry out methods to decrease the pain will improve Outcome: Completed/Met Goal: Individualized Educational Video(s) Outcome: Completed/Met   Problem: Coping: Goal: Ability to verbalize concerns and feelings about labor and delivery will improve Outcome: Completed/Met   Problem: Life Cycle: Goal: Ability to make normal progression through stages of labor will improve Outcome: Completed/Met Goal: Ability to effectively push during vaginal delivery will improve Outcome: Completed/Met   Problem: Role Relationship: Goal: Will demonstrate positive interactions with the child Outcome: Completed/Met   Problem: Safety: Goal: Risk of complications during labor and delivery will decrease Outcome: Completed/Met   Problem: Pain Management: Goal: Relief or control of pain from uterine contractions will improve Outcome: Completed/Met   Problem: Education: Goal: Knowledge of General Education information will improve Description: Including pain rating scale, medication(s)/side effects and non-pharmacologic comfort measures Outcome: Completed/Met   Problem: Health Behavior/Discharge Planning: Goal: Ability to manage health-related needs will improve Outcome: Completed/Met   Problem: Clinical Measurements: Goal: Ability to maintain clinical measurements within normal limits will improve Outcome: Completed/Met Goal: Will remain free from infection Outcome: Completed/Met Goal: Diagnostic test results will improve Outcome: Completed/Met Goal: Respiratory complications will improve Outcome: Completed/Met Goal: Cardiovascular complication will be avoided Outcome: Completed/Met   Problem: Activity: Goal: Risk for activity intolerance will decrease Outcome:  Completed/Met   Problem: Nutrition: Goal: Adequate nutrition will be maintained Outcome: Completed/Met   Problem: Coping: Goal: Level of anxiety will decrease Outcome: Completed/Met   Problem: Elimination: Goal: Will not experience complications related to bowel motility Outcome: Completed/Met Goal: Will not experience complications related to urinary retention Outcome: Completed/Met   Problem: Pain Managment: Goal: General experience of comfort will improve Outcome: Completed/Met   Problem: Safety: Goal: Ability to remain free from injury will improve Outcome: Completed/Met   Problem: Skin Integrity: Goal: Risk for impaired skin integrity will decrease Outcome: Completed/Met   Problem: Education: Goal: Knowledge of condition will improve Outcome: Completed/Met   Problem: Activity: Goal: Will verbalize the importance of balancing activity with adequate rest periods Outcome: Completed/Met Goal: Ability to tolerate increased activity will improve Outcome: Completed/Met   Problem: Role Relationship: Goal: Ability to demonstrate positive interaction with newborn will improve Outcome: Completed/Met

## 2020-09-26 NOTE — Discharge Instructions (Signed)
-take tylenol 1000 mg every 6 hours as needed for pain, alternate with ibuprofen 600 mg every 6 hours -drink plenty of water to help with breastfeeding -continue prenatal vitamins while you are breastfeeding -think about birth control options-->bedisider.org is a great website! You can get any form of birth control from the health department for free   Postpartum Care After Vaginal Delivery The following information offers guidance about how to care for yourself from the time you deliver your baby to 6-12 weeks after delivery (postpartum period). If you have problems or questions, contact your health care provider for more specific instructions. Follow these instructions at home: Vaginal bleeding  It is normal to have vaginal bleeding (lochia) after delivery. Wear a sanitary pad for bleeding and discharge. ? During the first week after delivery, the amount and appearance of lochia is often similar to a menstrual period. ? Over the next few weeks, it will gradually decrease to a dry, yellow-brown discharge. ? For most women, lochia stops completely by 4-6 weeks after delivery, but can vary.  Change your sanitary pads frequently. Watch for any changes in your flow, such as: ? A sudden increase in volume. ? A change in color. ? Large blood clots.  If you pass a blood clot from your vagina, save it and call your health care provider. Do not flush blood clots down the toilet before talking with your health care provider.  Do not use tampons or douches until your health care provider approves.  If you are not breastfeeding, your period should return 6-8 weeks after delivery. If you are feeding your baby breast milk only, your period may not return until you stop breastfeeding. Perineal care  Keep the area between the vagina and the anus (perineum) clean and dry. Use medicated pads and pain-relieving sprays and creams as directed.  If you had a surgical cut in the perineum (episiotomy) or a  tear, check the area for signs of infection until you are healed. Check for: ? More redness, swelling, or pain. ? Fluid or blood coming from the cut or tear. ? Warmth. ? Pus or a bad smell.  You may be given a squirt bottle to use instead of wiping to clean the perineum area after you use the bathroom. Pat the area gently to dry it.  To relieve pain caused by an episiotomy, a tear, or swollen veins in the anus (hemorrhoids), take a warm sitz bath 2-3 times a day. In a sitz bath, the warm water should only come up to your hips and cover your buttocks.   Breast care  In the first few days after delivery, your breasts may feel heavy, full, and uncomfortable (breast engorgement). Milk may also leak from your breasts. Ask your health care provider about ways to help relieve the discomfort.  If you are breastfeeding: ? Wear a bra that supports your breasts and fits well. Use breast pads to absorb milk that leaks. ? Keep your nipples clean and dry. Apply creams and ointments as told. ? You may have uterine contractions every time you breastfeed for up to several weeks after delivery. This helps your uterus return to its normal size. ? If you have any problems with breastfeeding, notify your health care provider or lactation consultant.  If you are not breastfeeding: ? Avoid touching your breasts. Do not squeeze out (express) milk. Doing this can make your breasts produce more milk. ? Wear a good-fitting bra and use cold packs to help with swelling. Intimacy   and sexuality  Ask your health care provider when you can engage in sexual activity. This may depend upon: ? Your risk of infection. ? How fast you are healing. ? Your comfort and desire to engage in sexual activity.  You are able to get pregnant after delivery, even if you have not had your period. Talk with your health care provider about methods of birth control (contraception) or family planning if you desire future  pregnancies. Medicines  Take over-the-counter and prescription medicines only as told by your health care provider.  Take an over-the-counter stool softener to help ease bowel movements as told by your health care provider.  If you were prescribed an antibiotic medicine, take it as told by your health care provider. Do not stop taking the antibiotic even if you start to feel better.  Review all previous and current prescriptions to check for possible transfer into breast milk. Activity  Gradually return to your normal activities as told by your health care provider.  Rest as much as possible. Nap while your baby is sleeping. Eating and drinking  Drink enough fluid to keep your urine pale yellow.  To help prevent or relieve constipation, eat high-fiber foods every day.  Choose healthy eating to support breastfeeding or weight loss goals.  Take your prenatal vitamins until your health care provider tells you to stop.   General tips/recommendations  Do not use any products that contain nicotine or tobacco. These products include cigarettes, chewing tobacco, and vaping devices, such as e-cigarettes. If you need help quitting, ask your health care provider.  Do not drink alcohol, especially if you are breastfeeding.  Do not take medications or drugs that are not prescribed to you, especially if you are breastfeeding.  Visit your health care provider for a postpartum checkup within the first 3-6 weeks after delivery.  Complete a comprehensive postpartum visit no later than 12 weeks after delivery.  Keep all follow-up visits for you and your baby. Contact a health care provider if:  You feel unusually sad or worried.  Your breasts become red, painful, or hard.  You have a fever or other signs of an infection.  You have bleeding that is soaking through one pad an hour or you have blood clots.  You have a severe headache that doesn't go away or you have vision changes.  You  have nausea and vomiting and are unable to eat or drink anything for 24 hours. Get help right away if:  You have chest pain or difficulty breathing.  You have sudden, severe leg pain.  You faint or have a seizure.  You have thoughts about hurting yourself or your baby. If you ever feel like you may hurt yourself or others, or have thoughts about taking your own life, get help right away. Go to your nearest emergency department or:  Call your local emergency services (911 in the [place]  The National Suicide Prevention Lifeline at 1-800-273-8255. This suicide crisis helpline is open 24 hours a day.  Text the Crisis Text Line at 741741 (in the [place] Summary  The period of time after you deliver your newborn up to 6-12 weeks after delivery is called the postpartum period.  Keep all follow-up visits for you and your baby.  Review all previous and current prescriptions to check for possible transfer into breast milk.  Contact a health care provider if you feel unusually sad or worried during the postpartum period. This information is not intended to replace advice given to   you by your health care provider. Make sure you discuss any questions you have with your health care provider. Document Revised: 04/01/2020 Document Reviewed: 04/01/2020 Elsevier Patient Education  2021 [employer]vier Inc.  

## 2020-09-26 NOTE — Progress Notes (Signed)
CSW received consult for late and limited PNC.  CSW reviewed chart and is screening out consult as it does not meet criteria for automatic CSW involvement and infant drug screening.  MOB started care prior to 28 weeks and had more than 3 visits.  Please contact CSW if current concerns arise or by MOB's request.   Sierra Thomas, MSW, LCSW Clinical Social Work [phone] 

## 2020-09-27 ENCOUNTER — Encounter: Payer: Medicaid Other | Admitting: Obstetrics and Gynecology

## 2020-09-27 ENCOUNTER — Other Ambulatory Visit: Payer: Self-pay

## 2020-09-27 LAB — TYPE AND SCREEN
ABO/RH(D): O POS
Antibody Screen: POSITIVE
Donor AG Type: NEGATIVE
Donor AG Type: NEGATIVE
Unit division: 0
Unit division: 0

## 2020-09-27 LAB — BPAM RBC
Blood Product Expiration Date: 202203282359
Blood Product Expiration Date: 202203282359
Unit Type and Rh: 5100
Unit Type and Rh: 5100

## 2020-10-07 ENCOUNTER — Encounter (HOSPITAL_COMMUNITY): Payer: Self-pay

## 2020-10-07 ENCOUNTER — Other Ambulatory Visit: Payer: Self-pay

## 2020-10-07 ENCOUNTER — Ambulatory Visit (HOSPITAL_COMMUNITY)
Admission: EM | Admit: 2020-10-07 | Discharge: 2020-10-07 | Disposition: A | Discharge status: Home / Self Care | Payer: Medicaid Other | Attending: Student | Admitting: Student

## 2020-10-07 DIAGNOSIS — N61 Mastitis without abscess: Secondary | ICD-10-CM | POA: Diagnosis not present

## 2020-10-07 DIAGNOSIS — R21 Rash and other nonspecific skin eruption: Secondary | ICD-10-CM | POA: Diagnosis not present

## 2020-10-07 MED ORDER — TRIAMCINOLONE ACETONIDE 0.1 % EX CREA: 1.0000 "application " | TOPICAL_CREAM | Freq: Two times a day (BID) | CUTANEOUS | 0 refills | Status: DC

## 2020-10-07 MED ORDER — CEPHALEXIN 500 MG PO CAPS: 500.0000 mg | ORAL_CAPSULE | Freq: Four times a day (QID) | ORAL | 0 refills | Status: DC

## 2020-10-07 NOTE — ED Triage Notes (Signed)
Pt present a rash that is located on hands/feet. Pt noticed the rash on Tuesday. Pt also is C/O of a headache, symptoms started two days ago.

## 2020-10-07 NOTE — Discharge Instructions (Addendum)
-  For your breast/skin infection, start the antibiotic - Keflex, 4 pills daily for 5 days. You can mention this to your doctor at your next checkup, to make sure you're healing well. You can also come back and see [place] if your symptoms worsen or persist. This medication is okay to take while breastfeeding. -For your hand and foot rash, apply the steroid (triamcinolone) cream 1-2x daily for about 1 week. You can also use a thick emollient cream to moisturize. -Seek immediate medical attention if you develop worsening of symptoms despite treatment; fevers/chills; etc.

## 2020-10-07 NOTE — ED Provider Notes (Signed)
[facility]    CSN: [id number] Arrival date & time: 10/07/20  1650      History   Chief Complaint Chief Complaint  Patient presents with  . Rash  . Headache    HPI Sierra Thomas is a 30 y.o. female presenting with rash on hands and feet x2 days; rash on breasts for 3 days; also with headache. History vaginal delivery 09/26/2020; this patient is currently breastfeeding.  -Notes red rash on bilateral breasts surrounding nipples. Denies itching, discharge, fevers/chills. States no issues with breast milk. Denies nipple discharge, bleeding. -Notes itchy rash on hands and feet for 2 days. Denies oral/mucosal lesions. Denies fevers/chills, n/v/d, shortness of breath, chest pain, cough, congestion, facial pain, teeth pain, sore throat, loss of taste/smell, swollen lymph nodes, ear pain.  -Occ mild headache. Denies worst headache of life, thunderclap headache, weakness/sensation changes in arms/legs, vision changes, shortness of breath, chest pain/pressure, photophobia, phonophobia, n/v/d.    HPI  Past Medical History:  Diagnosis Date  . Anemia   . Anemia 01/10/2019   CBC Latest Ref Rng & Units 06/21/2020 01/11/2019 01/10/2019 WBC 3.4 - 10.8 x10E3/uL 4.4 9.6 6.9 Hemoglobin 11.1 - 15.9 g/dL 12.0 10.7(L) 11.8(L) Hematocrit 34.0 - 46.6 % 34.5 32.7(L) 35.6(L) Platelets 150 - 450 x10E3/uL 150 228 220   . Medical history non-contributory     Patient Active Problem List   Diagnosis Date Noted  . Vaginal delivery 09/26/2020  . Post term pregnancy over 40 weeks 09/24/2020  . GBS (group B Streptococcus carrier), +RV culture, currently pregnant 08/26/2020  . Abnormal antibody titer 07/19/2020  . Supervision of other normal pregnancy, antepartum 06/21/2020    Past Surgical History:  Procedure Laterality Date  . NO PAST SURGERIES      OB History    Gravida  2   Para  2   Term  2   Preterm      AB      Living  2     SAB      IAB      Ectopic      Multiple   0   Live Births  2            Home Medications    Prior to Admission medications   Medication Sig Start Date End Date Taking? Authorizing Provider  cephALEXin (KEFLEX) 500 MG capsule Take 1 capsule (500 mg total) by mouth 4 (four) times daily. 10/07/20  Yes Hazel Sams, PA-C  triamcinolone (KENALOG) 0.1 % Apply 1 application topically 2 (two) times daily. 10/07/20  Yes Hazel Sams, PA-C  acetaminophen (TYLENOL) 500 MG tablet Take 2 tablets (1,000 mg total) by mouth every 6 (six) hours as needed for headache. Patient not taking: Reported on 09/14/2020 07/19/20   Nugent, Gerrie Nordmann, NP  Blood Pressure Monitoring (BLOOD PRESSURE KIT) DEVI Check BP readings regularly at home Patient not taking: No sig reported 06/21/20   Shelly Bombard, MD  coconut oil OIL Apply 1 application topically as needed. 0/93/26   Arrie Senate, MD  ibuprofen (ADVIL) 600 MG tablet Take 1 tablet (600 mg total) by mouth every 6 (six) hours as needed. 02/09/44   Arrie Senate, MD  Misc. Devices (GOJJI WEIGHT SCALE) MISC 1 Device by Does not apply route as needed. Patient not taking: No sig reported 06/21/20   Shelly Bombard, MD  Prenatal Vit-Fe Fumarate-FA (PREPLUS) 27-1 MG TABS Take 1 tablet by mouth daily. 05/25/20   Darlina Rumpf,  CNM    Family History Family History  Problem Relation Age of Onset  . Healthy Mother   . Healthy Father     Social History Social History   Tobacco Use  . Smoking status: Never Smoker  . Smokeless tobacco: Never Used  Vaping Use  . Vaping Use: Never used  Substance Use Topics  . Alcohol use: Never  . Drug use: Never     Allergies   Patient has no known allergies.   Review of Systems Review of Systems  Constitutional: Negative for appetite change, chills and fever.  HENT: Negative for congestion, ear pain, rhinorrhea, sinus pressure, sinus pain and sore throat.   Eyes: Negative for redness and visual disturbance.  Respiratory:  Negative for cough, chest tightness, shortness of breath and wheezing.   Cardiovascular: Negative for chest pain and palpitations.  Gastrointestinal: Negative for abdominal pain, constipation, diarrhea, nausea and vomiting.  Genitourinary: Negative for dysuria, frequency and urgency.  Musculoskeletal: Negative for myalgias.  Skin: Positive for rash.  Neurological: Positive for headaches. Negative for dizziness and weakness.  Psychiatric/Behavioral: Negative for confusion.  All other systems reviewed and are negative.    Physical Exam Triage Vital Signs ED Triage Vitals [10/07/20 1730]  Enc Vitals Group     BP 115/74     Pulse Rate 66     Resp 18     Temp 98 F (36.7 C)     Temp Source Oral     SpO2 97 %     Weight      Height      Head Circumference      Peak Flow      Pain Score 6     Pain Loc      Pain Edu?      Excl. in [place]?    No data found.  Updated Vital Signs BP 115/74 (BP Location: Right Arm)   Pulse 66   Temp 98 F (36.7 C) (Oral)   Resp 18   SpO2 97%   Visual Acuity Right Eye Distance:   Left Eye Distance:   Bilateral Distance:    Right Eye Near:   Left Eye Near:    Bilateral Near:     Physical Exam Vitals reviewed.  Constitutional:      Appearance: She is well-developed.  HENT:     Head: Normocephalic and atraumatic.  Cardiovascular:     Rate and Rhythm: Normal rate and regular rhythm.     Heart sounds: Normal heart sounds.  Pulmonary:     Effort: Pulmonary effort is normal.     Breath sounds: Normal breath sounds.  Skin:    Comments: Bilateral breasts with warm erythema surrounding nipples. No discharge.   Bilateral hands and feet with scattered erythematous papular rash. No burrows.  Neurological:     Mental Status: She is alert and oriented to person, place, and time.     Comments: CN 2-12 grossly intact.  Psychiatric:        Mood and Affect: Mood normal.        Speech: Speech normal.        Behavior: Behavior normal.      UC  Treatments / Results  Labs (all labs ordered are listed, but only abnormal results are displayed) Labs Reviewed - No data to display  EKG   Radiology No results found.  Procedures Procedures (including critical care time)  Medications Ordered in UC Medications - No data to display  Initial Impression / Assessment  and Plan / UC Course  I have reviewed the triage vital signs and the nursing notes.  Pertinent labs & imaging results that were available during my care of the patient were reviewed by me and considered in my medical decision making (see chart for details).     This patient is a 30 year old female presenting with mastitis and rash.   For mastitis, Keflex sent as below. She is currently breastfeeding. rec f/u with gyn. Continue breastfeeding.  For nonspecific rash of hands and feet, triamcinolone sent for symptomatic relief. No oral lesions, but hand foot and mouth is in my differential.   Return precautions discussed.   This chart was dictated using voice recognition software, Dragon. Despite the best efforts of this provider to proofread and correct errors, errors may still occur which can change documentation meaning.   Final Clinical Impressions(s) / UC Diagnoses   Final diagnoses:  Mastitis  Rash and nonspecific skin eruption  Patient is a currently breast-feeding mother     Discharge Instructions     -For your breast/skin infection, start the antibiotic - Keflex, 4 pills daily for 5 days. You can mention this to your doctor at your next checkup, to make sure you're healing well. You can also come back and see [place] if your symptoms worsen or persist. This medication is okay to take while breastfeeding. -For your hand and foot rash, apply the steroid (triamcinolone) cream 1-2x daily for about 1 week. You can also use a thick emollient cream to moisturize. -Seek immediate medical attention if you develop worsening of symptoms despite treatment; fevers/chills;  etc.    ED Prescriptions    Medication Sig Dispense Auth. Provider   cephALEXin (KEFLEX) 500 MG capsule Take 1 capsule (500 mg total) by mouth 4 (four) times daily. 20 capsule Marin Roberts E, PA-C   triamcinolone (KENALOG) 0.1 % Apply 1 application topically 2 (two) times daily. 30 g Hazel Sams, PA-C     PDMP not reviewed this encounter.   Hazel Sams, PA-C 10/07/20 1807

## 2020-10-22 ENCOUNTER — Ambulatory Visit: Payer: Medicaid Other | Admitting: Obstetrics & Gynecology

## 2020-11-10 ENCOUNTER — Ambulatory Visit: Payer: Medicaid Other | Admitting: Obstetrics

## 2020-11-11 ENCOUNTER — Ambulatory Visit (INDEPENDENT_AMBULATORY_CARE_PROVIDER_SITE_OTHER): Payer: Medicaid Other | Admitting: Obstetrics

## 2020-11-11 ENCOUNTER — Other Ambulatory Visit: Payer: Self-pay

## 2020-11-11 ENCOUNTER — Encounter: Payer: Self-pay | Admitting: Obstetrics

## 2020-11-11 DIAGNOSIS — Z3009 Encounter for other general counseling and advice on contraception: Secondary | ICD-10-CM

## 2020-11-11 NOTE — Progress Notes (Signed)
Post Partum Visit Note  Sierra Thomas is a 30 y.o. G66P2002 female who presents for a postpartum visit. She is 6 weeks postpartum following a normal spontaneous vaginal delivery.  I have fully reviewed the prenatal and intrapartum course. The delivery was at 40 gestational weeks.  Anesthesia: none. Postpartum course has been unremarkabale. Baby is doing well. Baby is feeding by breast. Bleeding staining only. Bowel function is normal. Bladder function is normal. Patient is not sexually active. Contraception method is none. Postpartum depression screening: negative.   The pregnancy intention screening data noted above was reviewed. Potential methods of contraception were discussed. The patient elected to proceed with No Method - Other Reason.    Edinburgh Postnatal Depression Scale - 11/11/20 1633      Edinburgh Postnatal Depression Scale:  In the Past 7 Days   I have been able to laugh and see the funny side of things. 0    I have looked forward with enjoyment to things. 0    I have blamed myself unnecessarily when things went wrong. 0    I have been anxious or worried for no good reason. 0    I have felt scared or panicky for no good reason. 0    Things have been getting on top of me. 0    I have been so unhappy that I have had difficulty sleeping. 0    I have felt sad or miserable. 0    I have been so unhappy that I have been crying. 0    The thought of harming myself has occurred to me. 0    Edinburgh Postnatal Depression Scale Total 0           Health Maintenance Due  Topic Date Due  . COVID-19 Vaccine (1) Never done    The following portions of the patient's history were reviewed and updated as appropriate: allergies, current medications, past family history, past medical history, past social history, past surgical history and problem list.  Review of Systems A comprehensive review of systems was negative.  Objective:  BP 108/66   Pulse (!) 56   Wt 148 lb  (67.1 kg)   Breastfeeding Yes   BMI 27.96 kg/m    General:  alert and no distress   Breasts:  normal  Lungs: clear to auscultation bilaterally  Heart:  regular rate and rhythm, S1, S2 normal, no murmur, click, rub or gallop  Abdomen: soft, non-tender; bowel sounds normal; no masses,  no organomegaly   Wound   n/a  GU exam:  normal       Assessment:      Plan:   Essential components of care per ACOG recommendations:  1.  Mood and well being: Patient with negative depression screening today.  - Patient does not use tobacco.  - hx of drug use? No    2. Infant care and feeding:  -Patient currently breastmilk feeding? Yes If breastmilk feeding discussed return to work and pumping. If needed, patient was provided letter for work to allow for every 2-3 hr pumping breaks, and to be granted a private location to express breastmilk and refrigerated area to store breastmilk. Reviewed importance of draining breast regularly to support lactation. -Social determinants of health (SDOH) reviewed in EPIC. No concerns  3. Sexuality, contraception and birth spacing - Patient does not want a pregnancy in the next year.  Desired family size is 2 children.  - Reviewed forms of contraception in tiered fashion. Patient desired no  method today.   She is considering options. - Discussed birth spacing of 18 months  4. Sleep and fatigue -Encouraged family/partner/community support of 4 hrs of uninterrupted sleep to help with mood and fatigue  5. Physical Recovery  - Discussed patients delivery and there were no complications - Patient had a Vaginal, no problems at delivery. Perineal healing reviewed. Patient expressed understanding - Patient has urinary incontinence? No - Patient is safe to resume physical and sexual activity  6.  Health Maintenance - HM due items addressed Yes - Last pap smear done 06/21/2020 and was normal with negative HPV. Pap smear not done at today's visit.   7. Chronic  Disease - none   Coral Ceo, MD [facility], [facility] 11/11/20

## 2020-11-29 ENCOUNTER — Encounter: Payer: Medicaid Other | Admitting: Obstetrics and Gynecology

## 2021-01-14 ENCOUNTER — Encounter (HOSPITAL_COMMUNITY): Payer: Self-pay | Admitting: Medical Oncology

## 2021-01-14 ENCOUNTER — Ambulatory Visit (HOSPITAL_COMMUNITY)
Admission: EM | Admit: 2021-01-14 | Discharge: 2021-01-14 | Disposition: A | Discharge status: Home / Self Care | Payer: Medicaid Other | Attending: Medical Oncology | Admitting: Medical Oncology

## 2021-01-14 ENCOUNTER — Other Ambulatory Visit: Payer: Self-pay

## 2021-01-14 DIAGNOSIS — L309 Dermatitis, unspecified: Secondary | ICD-10-CM | POA: Diagnosis not present

## 2021-01-14 DIAGNOSIS — B360 Pityriasis versicolor: Secondary | ICD-10-CM | POA: Diagnosis not present

## 2021-01-14 MED ORDER — KETOCONAZOLE 2 % EX SHAM: 1.0000 "application " | MEDICATED_SHAMPOO | CUTANEOUS | 0 refills | Status: DC

## 2021-01-14 MED ORDER — TRIAMCINOLONE ACETONIDE 0.1 % EX CREA: 1.0000 "application " | TOPICAL_CREAM | Freq: Two times a day (BID) | CUTANEOUS | 0 refills | Status: DC

## 2021-01-14 NOTE — ED Triage Notes (Signed)
Pt presents with possible allergic reaction and headache. States every day when she takes a bath, she itches all over.

## 2021-01-14 NOTE — ED Provider Notes (Signed)
[place]    CSN: [id number] Arrival date & time: 01/14/21  1215      History   Chief Complaint Chief Complaint  Patient presents with   Allergic Reaction   Pruritis    HPI Sierra Thomas is a 30 y.o. female.  Translator Marland Kitchen helping with the case.  HPI  Pruritis: Patient reports that every time she gets out of the bath she becomes very itchy for the past few years.  She is concerned that she might be allergic to something in the bath but reports not putting anything different in the bath. No new soaps, medications or products. No itchy contacts. She states that the itching stays for about 10-30 minutes and resolves on its own.    Past Medical History:  Diagnosis Date   Anemia    Anemia 01/10/2019   CBC Latest Ref Rng & Units 06/21/2020 01/11/2019 01/10/2019 WBC 3.4 - 10.8 x10E3/uL 4.4 9.6 6.9 Hemoglobin 11.1 - 15.9 g/dL 12.0 10.7(L) 11.8(L) Hematocrit 34.0 - 46.6 % 34.5 32.7(L) 35.6(L) Platelets 150 - 450 x10E3/uL 150 228 220    Medical history non-contributory     Patient Active Problem List   Diagnosis Date Noted   Vaginal delivery 09/26/2020   Post term pregnancy over 40 weeks 09/24/2020   GBS (group B Streptococcus carrier), +RV culture, currently pregnant 08/26/2020   Abnormal antibody titer 07/19/2020   Supervision of other normal pregnancy, antepartum 06/21/2020    Past Surgical History:  Procedure Laterality Date   NO PAST SURGERIES      OB History     Gravida  2   Para  2   Term  2   Preterm      AB      Living  2      SAB      IAB      Ectopic      Multiple  0   Live Births  2            Home Medications    Prior to Admission medications   Medication Sig Start Date End Date Taking? Authorizing Provider  acetaminophen (TYLENOL) 500 MG tablet Take 2 tablets (1,000 mg total) by mouth every 6 (six) hours as needed for headache. Patient not taking: Reported on 09/14/2020 07/19/20   Nugent, Gerrie Nordmann, NP   Blood Pressure Monitoring (BLOOD PRESSURE KIT) DEVI Check BP readings regularly at home Patient not taking: No sig reported 06/21/20   Shelly Bombard, MD  cephALEXin (KEFLEX) 500 MG capsule Take 1 capsule (500 mg total) by mouth 4 (four) times daily. 10/07/20   Hazel Sams, PA-C  coconut oil OIL Apply 1 application topically as needed. 11/07/79   Arrie Senate, MD  ibuprofen (ADVIL) 600 MG tablet Take 1 tablet (600 mg total) by mouth every 6 (six) hours as needed. 1/91/47   Arrie Senate, MD  Misc. Devices (GOJJI WEIGHT SCALE) MISC 1 Device by Does not apply route as needed. Patient not taking: No sig reported 06/21/20   Shelly Bombard, MD  Prenatal Vit-Fe Fumarate-FA (PREPLUS) 27-1 MG TABS Take 1 tablet by mouth daily. 05/25/20   Darlina Rumpf, CNM  triamcinolone (KENALOG) 0.1 % Apply 1 application topically 2 (two) times daily. 10/07/20   Hazel Sams, PA-C    Family History Family History  Problem Relation Age of Onset   Healthy Mother    Healthy Father     Social History Social History  Tobacco Use   Smoking status: Never   Smokeless tobacco: Never  Vaping Use   Vaping Use: Never used  Substance Use Topics   Alcohol use: Never   Drug use: Never     Allergies   Patient has no known allergies.   Review of Systems Review of Systems  As stated above in HPI Physical Exam Triage Vital Signs ED Triage Vitals  Enc Vitals Group     BP 01/14/21 1319 123/76     Pulse Rate 01/14/21 1319 67     Resp 01/14/21 1319 16     Temp 01/14/21 1319 98.8 F (37.1 C)     Temp Source 01/14/21 1319 Oral     SpO2 01/14/21 1319 99 %     Weight --      Height --      Head Circumference --      Peak Flow --      Pain Score 01/14/21 1316 3     Pain Loc --      Pain Edu? --      Excl. in [place]? --    No data found.  Updated Vital Signs BP 123/76 (BP Location: Right Arm)   Pulse 67   Temp 98.8 F (37.1 C) (Oral)   Resp 16   LMP  (LMP Unknown)   SpO2  99%   Breastfeeding Yes   Physical Exam Vitals and nursing note reviewed.  Constitutional:      General: She is not in acute distress.    Appearance: Normal appearance. She is not ill-appearing, toxic-appearing or diaphoretic.  Skin:    Comments: Some small dry patches of skin of legs. Also some well demarcated slightly hyperpigmented patches of the back   Neurological:     Mental Status: She is alert.     UC Treatments / Results  Labs (all labs ordered are listed, but only abnormal results are displayed) Labs Reviewed - No data to display  EKG   Radiology No results found.  Procedures Procedures (including critical care time)  Medications Ordered in UC Medications - No data to display  Initial Impression / Assessment and Plan / UC Course  I have reviewed the triage vital signs and the nursing notes.  Pertinent labs & imaging results that were available during my care of the patient were reviewed by me and considered in my medical decision making (see chart for details).     New.  I discussed with patient that this appears to be a combination of both eczema and tinea versicolor.  I have recommended that we treat the tinea versicolor first with shampoo.  I discussed that she will apply the shampoo from the neck on down waiting 10 minutes then rinsing off.  We will then apply triamcinolone to the areas as needed. Final Clinical Impressions(s) / UC Diagnoses   Final diagnoses:  None   Discharge Instructions   None    ED Prescriptions   None    PDMP not reviewed this encounter.   Hughie Closs, [place] 01/14/21 1448

## 2021-07-31 NOTE — L&D Delivery Note (Signed)
[facility] Delivery Note  Sierra Thomas is a 31 y.o. O1H0865 s/p VD at [redacted]w[redacted]d. She was admitted for SOL.   ROM: 2h 60m with moderate meconium stained fluid GBS Status:  Positive/-- (10/20 1105) Maximum Maternal Temperature: 98.0  Labor Progress:/- Initial SVE: 9/80/-2. She then progressed to complete.   Delivery Date/Time: 06/23/2022 @0601  Delivery: Called to room and patient was complete and pushing while squatting. Delivery call 2/2 moderate mec. Head delivered LOA. No nuchal cord present. Shoulder and body delivered in usual fashion. Infant with spontaneous cry, placed on mother's abdomen, dried and stimulated. Cord clamped x 2 after 1-minute delay, and cut by provider. The infant want take to the warmer and assessed by the neonatologist. Infant was promptly returned to mom for skin to skin. Cord blood drawn. Placenta delivered spontaneously with gentle cord traction. Fundus firm with massage and Pitocin. Labia, perineum, vagina, and cervix inspected with good hemostasis and without any lacerations.  Baby Weight: pending  Placenta: 3 vessel, intact. Sent to L&D Complications: None Lacerations: None EBL: 52 mL Analgesia: Maternally supported  Infant:  APGAR (1 MIN): 9   APGAR (5 MINS):  9  [name], DO OB Fellow, , [facility] 06/23/2022, 6:18 AM

## 2021-08-16 ENCOUNTER — Encounter (HOSPITAL_COMMUNITY): Payer: Self-pay

## 2021-08-16 ENCOUNTER — Other Ambulatory Visit: Payer: Self-pay

## 2021-08-16 ENCOUNTER — Ambulatory Visit (HOSPITAL_COMMUNITY)
Admission: EM | Admit: 2021-08-16 | Discharge: 2021-08-16 | Disposition: A | Discharge status: Home / Self Care | Payer: Medicaid Other | Attending: Emergency Medicine | Admitting: Emergency Medicine

## 2021-08-16 DIAGNOSIS — R1084 Generalized abdominal pain: Secondary | ICD-10-CM | POA: Diagnosis not present

## 2021-08-16 LAB — POCT URINALYSIS DIPSTICK, ED / UC
Bilirubin Urine: NEGATIVE
Glucose, UA: NEGATIVE mg/dL
Hgb urine dipstick: NEGATIVE
Ketones, ur: NEGATIVE mg/dL
Leukocytes,Ua: NEGATIVE
Nitrite: NEGATIVE
Protein, ur: NEGATIVE mg/dL
Specific Gravity, Urine: 1.02 (ref 1.005–1.030)
Urobilinogen, UA: 0.2 mg/dL (ref 0.0–1.0)
pH: 7 (ref 5.0–8.0)

## 2021-08-16 LAB — POC URINE PREG, ED: Preg Test, Ur: NEGATIVE

## 2021-08-16 MED ORDER — ALUM & MAG HYDROXIDE-SIMETH 200-200-20 MG/5ML PO SUSP
30.0000 mL | Freq: Once | ORAL | Status: AC
  Administered 2021-08-16: 30 mL via ORAL

## 2021-08-16 MED ORDER — LIDOCAINE VISCOUS HCL 2 % MT SOLN
OROMUCOSAL | Status: AC
  Filled 2021-08-16: qty 15

## 2021-08-16 MED ORDER — ACETAMINOPHEN 325 MG PO TABS
ORAL_TABLET | ORAL | Status: AC
  Filled 2021-08-16: qty 3

## 2021-08-16 MED ORDER — ACETAMINOPHEN 325 MG PO TABS
975.0000 mg | ORAL_TABLET | Freq: Once | ORAL | Status: AC
  Administered 2021-08-16: 975 mg via ORAL

## 2021-08-16 MED ORDER — LIDOCAINE VISCOUS HCL 2 % MT SOLN
15.0000 mL | Freq: Once | OROMUCOSAL | Status: AC
  Administered 2021-08-16: 15 mL via ORAL

## 2021-08-16 MED ORDER — ALUMINUM-MAGNESIUM-SIMETHICONE 200-200-20 MG/5ML PO SUSP: 30.0000 mL | Freq: Three times a day (TID) | ORAL | 0 refills | Status: DC

## 2021-08-16 MED ORDER — ONDANSETRON HCL 4 MG PO TABS: 4.0000 mg | ORAL_TABLET | Freq: Four times a day (QID) | ORAL | 0 refills | Status: DC

## 2021-08-16 MED ORDER — ALUM & MAG HYDROXIDE-SIMETH 200-200-20 MG/5ML PO SUSP
ORAL | Status: AC
  Filled 2021-08-16: qty 30

## 2021-08-16 NOTE — ED Triage Notes (Signed)
Pt presents with abdominal pain and HA x5 days

## 2021-08-16 NOTE — ED Provider Notes (Signed)
[place]    CSN: [id number] Arrival date & time: 08/16/21  1416      History   Chief Complaint Chief Complaint  Patient presents with   Abdominal Pain   Headache    HPI Margarette Vannatter is a 31 y.o. female.   Patient presents with generalized abdominal pain and frontal headache for 5 days.  Pain can be felt with movement.  Associated photophobia and phonophobia.  Nausea, vomiting, diarrhea, constipation, fever, chills, URI symptoms, heartburn, indigestion, bloating, increased gas production, blurred vision, floaters.  No known sick contacts.  Last bowel movement this morning, described as normal.  Tolerating food and liquids.  Currently breast-feeding.  Endorses adequate sleep.  Drinks approximately 3 bottles of water daily.  History of anemia.   Swahili interpreter used for entirety of exam    Past Medical History:  Diagnosis Date   Anemia    Anemia 01/10/2019   CBC Latest Ref Rng & Units 06/21/2020 01/11/2019 01/10/2019 WBC 3.4 - 10.8 x10E3/uL 4.4 9.6 6.9 Hemoglobin 11.1 - 15.9 g/dL 12.0 10.7(L) 11.8(L) Hematocrit 34.0 - 46.6 % 34.5 32.7(L) 35.6(L) Platelets 150 - 450 x10E3/uL 150 228 220    Medical history non-contributory     Patient Active Problem List   Diagnosis Date Noted   Vaginal delivery 09/26/2020   Post term pregnancy over 40 weeks 09/24/2020   GBS (group B Streptococcus carrier), +RV culture, currently pregnant 08/26/2020   Abnormal antibody titer 07/19/2020   Supervision of other normal pregnancy, antepartum 06/21/2020    Past Surgical History:  Procedure Laterality Date   NO PAST SURGERIES      OB History     Gravida  2   Para  2   Term  2   Preterm      AB      Living  2      SAB      IAB      Ectopic      Multiple  0   Live Births  2            Home Medications    Prior to Admission medications   Medication Sig Start Date End Date Taking? Authorizing Provider  Blood Pressure Monitoring (BLOOD  PRESSURE KIT) DEVI Check BP readings regularly at home Patient not taking: No sig reported 06/21/20   Shelly Bombard, MD  cephALEXin (KEFLEX) 500 MG capsule Take 1 capsule (500 mg total) by mouth 4 (four) times daily. Patient not taking: Reported on 08/16/2021 10/07/20   Hazel Sams, PA-C  coconut oil OIL Apply 1 application topically as needed. Patient not taking: Reported on 08/16/2021 9/93/71   Arrie Senate, MD  ibuprofen (ADVIL) 600 MG tablet Take 1 tablet (600 mg total) by mouth every 6 (six) hours as needed. Patient not taking: Reported on 08/16/2021 6/96/78   Arrie Senate, MD  ketoconazole (NIZORAL) 2 % shampoo Apply 1 application topically 2 (two) times a week. Patient not taking: Reported on 08/16/2021 01/17/21   Hughie Closs, PA-C  Misc. Devices (GOJJI WEIGHT SCALE) MISC 1 Device by Does not apply route as needed. Patient not taking: No sig reported 06/21/20   Shelly Bombard, MD  Prenatal Vit-Fe Fumarate-FA (PREPLUS) 27-1 MG TABS Take 1 tablet by mouth daily. Patient not taking: Reported on 08/16/2021 05/25/20   Darlina Rumpf, CNM  triamcinolone cream (KENALOG) 0.1 % Apply 1 application topically 2 (two) times daily. Patient not taking: Reported on 08/16/2021 01/14/21  Hughie Closs, PA-C    Family History Family History  Problem Relation Age of Onset   Healthy Mother    Healthy Father     Social History Social History   Tobacco Use   Smoking status: Never   Smokeless tobacco: Never  Vaping Use   Vaping Use: Never used  Substance Use Topics   Alcohol use: Never   Drug use: Never     Allergies   Patient has no known allergies.   Review of Systems Review of Systems  Constitutional: Negative.   HENT: Negative.    Respiratory: Negative.    Cardiovascular: Negative.   Gastrointestinal:  Positive for abdominal pain. Negative for abdominal distention, anal bleeding, blood in stool, constipation, diarrhea, nausea, rectal pain and  vomiting.  Skin: Negative.   Neurological:  Positive for headaches. Negative for dizziness, tremors, seizures, syncope, facial asymmetry, speech difficulty, weakness, light-headedness and numbness.    Physical Exam Triage Vital Signs ED Triage Vitals  Enc Vitals Group     BP 08/16/21 1503 124/73     Pulse Rate 08/16/21 1503 75     Resp 08/16/21 1503 14     Temp 08/16/21 1503 98.6 F (37 C)     Temp Source 08/16/21 1503 Oral     SpO2 08/16/21 1503 98 %     Weight --      Height --      Head Circumference --      Peak Flow --      Pain Score 08/16/21 1505 7     Pain Loc --      Pain Edu? --      Excl. in [place]? --    No data found.  Updated Vital Signs BP 124/73 (BP Location: Left Arm)    Pulse 75    Temp 98.6 F (37 C) (Oral)    Resp 14    SpO2 98%    Breastfeeding Yes   Visual Acuity Right Eye Distance:   Left Eye Distance:   Bilateral Distance:    Right Eye Near:   Left Eye Near:    Bilateral Near:     Physical Exam Constitutional:      Appearance: Normal appearance. She is normal weight.  HENT:     Head: Normocephalic.  Eyes:     Extraocular Movements: Extraocular movements intact.  Pulmonary:     Effort: Pulmonary effort is normal.  Abdominal:     General: Abdomen is flat. Bowel sounds are normal.     Palpations: Abdomen is soft.     Tenderness: There is generalized abdominal tenderness.     Hernia: No hernia is present.  Skin:    General: Skin is warm and dry.  Neurological:     Mental Status: She is alert and oriented to person, place, and time. Mental status is at baseline.  Psychiatric:        Mood and Affect: Mood normal.        Behavior: Behavior normal.     UC Treatments / Results  Labs (all labs ordered are listed, but only abnormal results are displayed) Labs Reviewed - No data to display  EKG   Radiology No results found.  Procedures Procedures (including critical care time)  Medications Ordered in UC Medications - No data to  display  Initial Impression / Assessment and Plan / UC Course  I have reviewed the triage vital signs and the nursing notes.  Pertinent labs & imaging results that were available  during my care of the patient were reviewed by me and considered in my medical decision making (see chart for details).  Generalized abdominal pain  Unknown etiology of symptoms, vital signs are stable, patient is in no signs of distress, generalized abdominal pain noted on the discussed findings with patient, urinalysis negative, urine pregnancy negative, attempted administration of GI cocktail.  Tylenol in office, after 10 to 15 minutes pain has started to decrease but patient has nausea due to medication, prescribed Maalox and Zofran for outpatient management, may use over-the-counter medication such as Tylenol or ibuprofen to help with pain, given strict precautions for worsening abdominal pain to go to nearest emergency department for further evaluation Final Clinical Impressions(s) / UC Diagnoses   Final diagnoses:  None   Discharge Instructions   None    ED Prescriptions   None    PDMP not reviewed this encounter.   Hans Eden, NP 08/16/21 1756

## 2021-08-16 NOTE — Discharge Instructions (Addendum)
The cause of your abdominal pain is unknown at this time  Your urinalysis was negative and you are not pregnant  You may use Maalox up to 4 times a day ideally before you eat to try to help with your stomach pain  You may use nausea medicine every 6 hours the medication makes you nauseous  You may take Tylenol 500mg  to 1000 mg every 6 hours to help with your headache and stomach pain  At any point if your abdominal pain worsens please go to the nearest emergency department for further evaluation

## 2022-01-10 ENCOUNTER — Other Ambulatory Visit: Payer: Self-pay

## 2022-01-10 ENCOUNTER — Encounter (HOSPITAL_COMMUNITY): Payer: Self-pay | Admitting: *Deleted

## 2022-01-10 ENCOUNTER — Ambulatory Visit (HOSPITAL_COMMUNITY)
Admission: EM | Admit: 2022-01-10 | Discharge: 2022-01-10 | Disposition: A | Discharge status: Home / Self Care | Payer: Medicaid Other | Attending: Student | Admitting: Student

## 2022-01-10 DIAGNOSIS — K429 Umbilical hernia without obstruction or gangrene: Secondary | ICD-10-CM

## 2022-01-10 DIAGNOSIS — Z3201 Encounter for pregnancy test, result positive: Secondary | ICD-10-CM

## 2022-01-10 LAB — POC URINE PREG, ED: Preg Test, Ur: POSITIVE — AB

## 2022-01-10 NOTE — Discharge Instructions (Addendum)
-  I think you have a small hernia over your bellybutton. This is a small hole in the abdominal wall. It's not a problem unless it gets bigger or something gets stuck in it. If your symptoms change or get worse - pain, constipation, nausea, etc - head to the ED. -Call your OB-GYN tomorrow to discuss the pregnancy and hernia.

## 2022-01-10 NOTE — ED Triage Notes (Signed)
Pt reports ABD pain since 2022 after giving birth. Pt reports she hurts around the umbilical area. Pt denies any N/V/D

## 2022-01-10 NOTE — ED Provider Notes (Signed)
[facility]    CSN: [id number] Arrival date & time: 01/10/22  1538      History   Chief Complaint Chief Complaint  Patient presents with   Abdominal Pain    HPI Sierra Thomas is a 31 y.o. female presenting with pain over the umbilicus for about 14 months since giving birth.  She states the pain is intermittent, and is typically accompanied by swelling.  Never any nausea, vomiting, or diarrhea.  She states she occasionally deals with constipation, but not recently.  Last bowel movement was 1 day ago and was normal, still passing gas.  She has not had a menstrual period since the last time she gave birth.  Denies urinary or vaginal symptoms.  HPI  Past Medical History:  Diagnosis Date   Anemia    Anemia 01/10/2019   CBC Latest Ref Rng & Units 06/21/2020 01/11/2019 01/10/2019 WBC 3.4 - 10.8 x10E3/uL 4.4 9.6 6.9 Hemoglobin 11.1 - 15.9 g/dL 44.0 10.7(L) 11.8(L) Hematocrit 34.0 - 46.6 % 34.5 32.7(L) 35.6(L) Platelets 150 - 450 x10E3/uL 150 228 220    Medical history non-contributory     Patient Active Problem List   Diagnosis Date Noted   Vaginal delivery 09/26/2020   Post term pregnancy over 40 weeks 09/24/2020   GBS (group B Streptococcus carrier), +RV culture, currently pregnant 08/26/2020   Abnormal antibody titer 07/19/2020   Supervision of other normal pregnancy, antepartum 06/21/2020    Past Surgical History:  Procedure Laterality Date   NO PAST SURGERIES      OB History     Gravida  2   Para  2   Term  2   Preterm      AB      Living  2      SAB      IAB      Ectopic      Multiple  0   Live Births  2            Home Medications    Prior to Admission medications   Medication Sig Start Date End Date Taking? Authorizing Provider  aluminum-magnesium hydroxide-simethicone (MAALOX) 200-200-20 MG/5ML SUSP Take 30 mLs by mouth 4 (four) times daily -  before meals and at bedtime. 08/16/21   White, Elita Boone, NP  ondansetron  (ZOFRAN) 4 MG tablet Take 1 tablet (4 mg total) by mouth every 6 (six) hours. 08/16/21   Valinda Hoar, NP    Family History Family History  Problem Relation Age of Onset   Healthy Mother    Healthy Father     Social History Social History   Tobacco Use   Smoking status: Never   Smokeless tobacco: Never  Vaping Use   Vaping Use: Never used  Substance Use Topics   Alcohol use: Never   Drug use: Never     Allergies   Patient has no known allergies.   Review of Systems Review of Systems  Constitutional:  Negative for appetite change, chills, diaphoresis, fever and unexpected weight change.  HENT:  Negative for congestion, ear pain, sinus pressure, sinus pain, sneezing, sore throat and trouble swallowing.   Respiratory:  Negative for cough, chest tightness and shortness of breath.   Cardiovascular:  Negative for chest pain.  Gastrointestinal:  Positive for abdominal pain. Negative for abdominal distention, anal bleeding, blood in stool, constipation, diarrhea, nausea, rectal pain and vomiting.  Genitourinary:  Negative for dysuria, flank pain, frequency and urgency.  Musculoskeletal:  Negative for back  pain and myalgias.  Neurological:  Negative for dizziness, light-headedness and headaches.  All other systems reviewed and are negative.    Physical Exam Triage Vital Signs ED Triage Vitals  Enc Vitals Group     BP 01/10/22 1613 103/68     Pulse Rate 01/10/22 1613 71     Resp 01/10/22 1613 18     Temp 01/10/22 1613 98.4 F (36.9 C)     Temp src --      SpO2 01/10/22 1613 98 %     Weight --      Height --      Head Circumference --      Peak Flow --      Pain Score 01/10/22 1610 8     Pain Loc --      Pain Edu? --      Excl. in [place]? --    No data found.  Updated Vital Signs BP 103/68   Pulse 71   Temp 98.4 F (36.9 C)   Resp 18   SpO2 98%   Breastfeeding Yes   Visual Acuity Right Eye Distance:   Left Eye Distance:   Bilateral Distance:    Right  Eye Near:   Left Eye Near:    Bilateral Near:     Physical Exam Vitals reviewed.  Constitutional:      General: She is not in acute distress.    Appearance: Normal appearance. She is not ill-appearing.  HENT:     Head: Normocephalic and atraumatic.     Mouth/Throat:     Mouth: Mucous membranes are moist.     Comments: Moist mucous membranes Eyes:     Extraocular Movements: Extraocular movements intact.     Pupils: Pupils are equal, round, and reactive to light.  Cardiovascular:     Rate and Rhythm: Normal rate and regular rhythm.     Heart sounds: Normal heart sounds.  Pulmonary:     Effort: Pulmonary effort is normal.     Breath sounds: Normal breath sounds. No wheezing, rhonchi or rales.  Abdominal:     General: Bowel sounds are normal. There is no distension.     Palpations: Abdomen is soft. There is no mass.     Tenderness: There is no abdominal tenderness. There is no right CVA tenderness, left CVA tenderness, guarding or rebound.     Comments: Pregnant.  There is 1cm palpable hernia overlying the umbilicus, tender, but soft and reducible. Patient comfortable throughout exam. No other abdominal tenderness.   Skin:    General: Skin is warm.     Capillary Refill: Capillary refill takes less than 2 seconds.     Comments: Good skin turgor  Neurological:     General: No focal deficit present.     Mental Status: She is alert and oriented to person, place, and time.  Psychiatric:        Mood and Affect: Mood normal.        Behavior: Behavior normal.      UC Treatments / Results  Labs (all labs ordered are listed, but only abnormal results are displayed) Labs Reviewed  POC URINE PREG, ED - Abnormal; Notable for the following components:      Result Value   Preg Test, Ur POSITIVE (*)    All other components within normal limits    EKG   Radiology No results found.  Procedures Procedures (including critical care time)  Medications Ordered in UC Medications -  No data to  display  Initial Impression / Assessment and Plan / UC Course  I have reviewed the triage vital signs and the nursing notes.  Pertinent labs & imaging results that were available during my care of the patient were reviewed by me and considered in my medical decision making (see chart for details).     This patient is a very pleasant 31 y.o. year old female presenting with pregnancy and umbilical hernia. Afebrile, nontachy. The hernia has been present for >1 year, and is currently soft and reducible. She last gave birth 2/23 and has not had a menstrual period since then; u-preg is positive today. She is unsure how far along she is. No vaginal or urinary symptoms today. Advised her to call her OB tomorrow to follow-up, or head to the ED if new symptoms like severe abd pain or constipation. She is in agreement. ED return precautions discussed. Patient verbalizes understanding and agreement.    Final Clinical Impressions(s) / UC Diagnoses   Final diagnoses:  Umbilical hernia without obstruction and without gangrene  Positive pregnancy test     Discharge Instructions      -I think you have a small hernia over your bellybutton. This is a small hole in the abdominal wall. It's not a problem unless it gets bigger or something gets stuck in it. If your symptoms change or get worse - pain, constipation, nausea, etc - head to the ED. -Call your OB-GYN tomorrow to discuss the pregnancy and hernia.    ED Prescriptions   None    PDMP not reviewed this encounter.   Rhys Martini, PA-C 01/10/22 1640

## 2022-01-17 ENCOUNTER — Encounter (HOSPITAL_COMMUNITY): Payer: Self-pay | Admitting: Emergency Medicine

## 2022-01-17 ENCOUNTER — Other Ambulatory Visit: Payer: Self-pay

## 2022-01-17 ENCOUNTER — Emergency Department (HOSPITAL_COMMUNITY)
Admission: EM | Admit: 2022-01-17 | Discharge: 2022-01-17 | Disposition: A | Discharge status: Home / Self Care | Payer: Medicaid Other | Attending: Emergency Medicine | Admitting: Emergency Medicine

## 2022-01-17 DIAGNOSIS — L03012 Cellulitis of left finger: Secondary | ICD-10-CM | POA: Diagnosis present

## 2022-01-17 MED ORDER — ACETAMINOPHEN 500 MG PO TABS
1000.0000 mg | ORAL_TABLET | Freq: Once | ORAL | Status: AC
  Administered 2022-01-17: 1000 mg via ORAL
  Filled 2022-01-17: qty 2

## 2022-01-17 MED ORDER — NAPROXEN 250 MG PO TABS: 500.0000 mg | ORAL_TABLET | Freq: Once | ORAL | Status: DC

## 2022-01-17 NOTE — Discharge Instructions (Addendum)
You may alternate ibuprofen and Tylenol to help with your symptoms.  If you experience any redness, worsening symptoms you may return to the emergency department.

## 2022-01-17 NOTE — ED Triage Notes (Signed)
Pt states she developed a closed wound around her left ring finger on Friday. Site has no drainage but is white like is has pus under it.

## 2022-01-17 NOTE — ED Provider Notes (Signed)
[facility] EMERGENCY DEPARTMENT Provider Note   CSN: [id number] Arrival date & time: 01/17/22  1033     History  Chief Complaint  Patient presents with   Wound Infection    Sierra Thomas is a 31 y.o. female.  31 year old female with no past medical history presents to the ED with a chief complaint of paronychia to her left ring finger for the past 4 days.  Reports pain along the area, some pus collection at the site of the nailbed.  Exacerbated with any type of movement.  No fevers, no prior history of diabetes.  Patient is currently breast-feeding.  No other complaints.  The history is provided by the patient and medical records.       Home Medications Prior to Admission medications   Medication Sig Start Date End Date Taking? Authorizing Provider  aluminum-magnesium hydroxide-simethicone (MAALOX) 200-200-20 MG/5ML SUSP Take 30 mLs by mouth 4 (four) times daily -  before meals and at bedtime. 08/16/21   White, Elita Boone, NP  ondansetron (ZOFRAN) 4 MG tablet Take 1 tablet (4 mg total) by mouth every 6 (six) hours. 08/16/21   Valinda Hoar, NP      Allergies    Patient has no known allergies.    Review of Systems   Review of Systems  Constitutional:  Negative for fever.  Skin:  Positive for wound.    Physical Exam Updated Vital Signs BP 116/80 (BP Location: Right Arm)   Pulse 80   Temp 98.7 F (37.1 C) (Oral)   Resp 14   SpO2 100%  Physical Exam Vitals and nursing note reviewed.  Constitutional:      Appearance: Normal appearance.  HENT:     Head: Normocephalic and atraumatic.  Cardiovascular:     Rate and Rhythm: Normal rate.  Pulmonary:     Effort: Pulmonary effort is normal.  Abdominal:     General: Abdomen is flat.  Musculoskeletal:     Left hand: Tenderness present. No bony tenderness. Normal strength. Normal sensation. Normal capillary refill. Normal pulse.     Cervical back: Normal range of motion and neck supple.      Comments: Left ring finger with small pus collection noted, not actively draining.   Skin:    General: Skin is warm and dry.  Neurological:     Mental Status: She is alert.     ED Results / Procedures / Treatments   Labs (all labs ordered are listed, but only abnormal results are displayed) Labs Reviewed - No data to display  EKG None  Radiology No results found.  Procedures .Marland KitchenIncision and Drainage  Date/Time: 01/17/2022 1:07 PM  Performed by: Claude Manges, PA-C Authorized by: Claude Manges, PA-C   Consent:    Consent obtained:  Verbal   Consent given by:  Patient   Risks discussed:  Bleeding   Alternatives discussed:  No treatment Universal protocol:    Patient identity confirmed:  Verbally with patient Location:    Type:  Abscess   Location:  Upper extremity   Upper extremity location:  Finger   Finger location:  L ring finger Pre-procedure details:    Skin preparation:  Chlorhexidine with alcohol Sedation:    Sedation type:  None Anesthesia:    Anesthesia method:  None Procedure details:    Incision types:  Single straight   Incision depth:  Dermal   Drainage:  Serosanguinous   Drainage amount:  Moderate   Wound treatment:  Wound left open  Packing materials:  None Post-procedure details:    Procedure completion:  Tolerated well, no immediate complications    Medications Ordered in ED Medications  acetaminophen (TYLENOL) tablet 1,000 mg (has no administration in time range)    ED Course/ Medical Decision Making/ A&P                           Medical Decision Making Risk OTC drugs.   Patient presents with paronychia that is been ongoing for the past 4 days.  Has tried over-the-counter treatment without much improvement.  No prior history of these, nondiabetic.  Discussed I&D with patient at length who is to proceed.  Patient had I&D drained by me, tolerated procedure well.  Sent home in stable condition.   Portions of this note were generated  with Scientist, clinical (histocompatibility and immunogenetics). Dictation errors may occur despite best attempts at proofreading.   Final Clinical Impression(s) / ED Diagnoses Final diagnoses:  Paronychia of left ring finger    Rx / DC Orders ED Discharge Orders     None         Claude Manges, PA-C 01/17/22 1308    Milagros Loll, MD 01/17/22 1413

## 2022-03-15 ENCOUNTER — Ambulatory Visit (INDEPENDENT_AMBULATORY_CARE_PROVIDER_SITE_OTHER): Payer: Medicaid Other

## 2022-03-15 VITALS — BP 100/62 | HR 76 | Wt 146.0 lb

## 2022-03-15 DIAGNOSIS — Z3480 Encounter for supervision of other normal pregnancy, unspecified trimester: Secondary | ICD-10-CM

## 2022-03-15 DIAGNOSIS — Z3201 Encounter for pregnancy test, result positive: Secondary | ICD-10-CM

## 2022-03-15 LAB — POCT URINE PREGNANCY: Preg Test, Ur: POSITIVE — AB

## 2022-03-15 NOTE — Progress Notes (Signed)
PRENATAL INTAKE SUMMARY  Ms. Meanor presents today New OB Nurse Interview.  OB History     Gravida  3   Para  2   Term  2   Preterm      AB      Living  2      SAB      IAB      Ectopic      Multiple  0   Live Births  2          I have reviewed the patient's medical, obstetrical, social, and family histories, medications, and available lab results.  SUBJECTIVE She has no unusual complaints and complains of headache, abdominal pain in the lower midline, and nausea and occasional vomiting.   OBJECTIVE Initial Physical Exam (New OB)  GENERAL APPEARANCE: alert, well appearing, in no apparent distress   ASSESSMENT: Positive UPT Pregnancy of unknown gestational age. LMP unknown.  Fetal HR 160 Movement present Denies pain or pressure No vaginal bleeding Denies swelling   PLAN Prenatal care OB Pnl/HIV  OB Urine Culture HCG    Prenatal vitamin samples given Vitamed MD chewable tablets LOT [id number] EXP 04/29/2022 5 boxes (10 day supply)  Concept DHA NDC 53614-431-54 EXP 12/28/2022 6 bottles (24 day supply)  (2) 500 mg Tylenol Tablets given  NDC 00867-619-50 EXP 05/30/2024

## 2022-03-15 NOTE — Progress Notes (Signed)
Agree with nurses's documentation of this patient's clinic encounter.  Aeson Sawyers L, MD  

## 2022-03-17 LAB — URINE CULTURE, OB REFLEX: Organism ID, Bacteria: NO GROWTH

## 2022-03-17 LAB — CULTURE, OB URINE

## 2022-03-20 LAB — CBC/D/PLT+RPR+RH+ABO+RUBIGG...
Basophils Absolute: 0 10*3/uL (ref 0.0–0.2)
Basos: 0 %
EOS (ABSOLUTE): 0 10*3/uL (ref 0.0–0.4)
Eos: 1 %
HCV Ab: NONREACTIVE
HIV Screen 4th Generation wRfx: NONREACTIVE
Hematocrit: 34.7 % (ref 34.0–46.6)
Hemoglobin: 12.2 g/dL (ref 11.1–15.9)
Hepatitis B Surface Ag: NEGATIVE
Immature Grans (Abs): 0 10*3/uL (ref 0.0–0.1)
Immature Granulocytes: 0 %
Lymphocytes Absolute: 1 10*3/uL (ref 0.7–3.1)
Lymphs: 24 %
MCH: 33 pg (ref 26.6–33.0)
MCHC: 35.2 g/dL (ref 31.5–35.7)
MCV: 94 fL (ref 79–97)
Monocytes Absolute: 0.2 10*3/uL (ref 0.1–0.9)
Monocytes: 4 %
Neutrophils Absolute: 3 10*3/uL (ref 1.4–7.0)
Neutrophils: 71 %
Platelets: 181 10*3/uL (ref 150–450)
RBC: 3.7 x10E6/uL — ABNORMAL LOW (ref 3.77–5.28)
RDW: 12.1 % (ref 11.7–15.4)
RPR Ser Ql: NONREACTIVE
Rh Factor: POSITIVE
Rubella Antibodies, IGG: 5.83 index (ref >0.99)
WBC: 4.3 10*3/uL (ref 3.4–10.8)

## 2022-03-20 LAB — BETA HCG QUANT (REF LAB): hCG Quant: 2477 m[IU]/mL

## 2022-03-20 LAB — HCV INTERPRETATION

## 2022-03-20 LAB — AB SCR+ANTIBODY ID: Antibody Screen: POSITIVE — AB

## 2022-04-05 ENCOUNTER — Encounter: Payer: Self-pay | Admitting: *Deleted

## 2022-04-11 ENCOUNTER — Other Ambulatory Visit: Payer: Self-pay | Admitting: *Deleted

## 2022-04-11 ENCOUNTER — Ambulatory Visit: Payer: Medicaid Other | Attending: Obstetrics and Gynecology

## 2022-04-11 ENCOUNTER — Encounter: Payer: Self-pay | Admitting: *Deleted

## 2022-04-11 ENCOUNTER — Ambulatory Visit: Payer: Medicaid Other | Admitting: *Deleted

## 2022-04-11 DIAGNOSIS — Z3A30 30 weeks gestation of pregnancy: Secondary | ICD-10-CM | POA: Diagnosis not present

## 2022-04-11 DIAGNOSIS — Z3201 Encounter for pregnancy test, result positive: Secondary | ICD-10-CM | POA: Insufficient documentation

## 2022-04-11 DIAGNOSIS — O0933 Supervision of pregnancy with insufficient antenatal care, third trimester: Secondary | ICD-10-CM | POA: Insufficient documentation

## 2022-04-11 DIAGNOSIS — Z363 Encounter for antenatal screening for malformations: Secondary | ICD-10-CM | POA: Diagnosis present

## 2022-04-11 DIAGNOSIS — Z3480 Encounter for supervision of other normal pregnancy, unspecified trimester: Secondary | ICD-10-CM | POA: Diagnosis not present

## 2022-04-11 DIAGNOSIS — Z3689 Encounter for other specified antenatal screening: Secondary | ICD-10-CM

## 2022-05-10 ENCOUNTER — Ambulatory Visit: Payer: Medicaid Other | Admitting: *Deleted

## 2022-05-10 ENCOUNTER — Ambulatory Visit: Payer: Medicaid Other | Attending: Maternal & Fetal Medicine

## 2022-05-10 VITALS — BP 107/61 | HR 79

## 2022-05-10 DIAGNOSIS — O0933 Supervision of pregnancy with insufficient antenatal care, third trimester: Secondary | ICD-10-CM | POA: Diagnosis present

## 2022-05-10 DIAGNOSIS — Z3689 Encounter for other specified antenatal screening: Secondary | ICD-10-CM | POA: Diagnosis not present

## 2022-05-11 ENCOUNTER — Encounter: Payer: Medicaid Other | Admitting: Obstetrics and Gynecology

## 2022-05-18 NOTE — Progress Notes (Signed)
Subjective:  Sierra Thomas is a [id number] [redacted]w[redacted]d being seen today for her first obstetrical visit.  Her obstetrical history is significant for  insufficient prenatal care . Patient does intend to breast feed. Pregnancy history fully reviewed.  2 prior term pregnancies, SVDx2 without complications. Unsure of weights of prior babies, feels this baby is about the same, maybe slightly bigger. Not sure of PP contraception choice but will review options. Breastfed first babies without issues, plans to breastfeed gain. Currently living with husband, feels he is supportive.   Patient reports no bleeding, no contractions, no cramping, and no leaking.  BP 106/68   Pulse 76   Wt 151 lb (68.5 kg)   LMP  (LMP Unknown)   BMI 28.53 kg/m   HISTORY: OB History  Gravida Para Term Preterm AB Living  3 2 2     2   SAB IAB Ectopic Multiple Live Births        0 2    # Outcome Date GA Lbr Len/2nd Weight Sex Delivery Anes PTL Lv  3 Current           2 Term 09/24/20 [redacted]w[redacted]d / 00:09 7 lb 10.8 oz (3.481 kg) M Vag-Spont None  LIV     Birth Comments: none  1 Term 01/10/19 [redacted]w[redacted]d 04:47 / 01:05 7 lb 1.8 oz (3.226 kg) M Vag-Spont None  LIV     Birth Comments: wnl    Past Medical History:  Diagnosis Date   Anemia    Anemia 01/10/2019   CBC Latest Ref Rng & Units 06/21/2020 01/11/2019 01/10/2019 WBC 3.4 - 10.8 x10E3/uL 4.4 9.6 6.9 Hemoglobin 11.1 - 15.9 g/dL 03/12/2019 10.7(L) 11.8(L) Hematocrit 34.0 - 46.6 % 34.5 32.7(L) 35.6(L) Platelets 150 - 450 x10E3/uL 150 228 220     Past Surgical History:  Procedure Laterality Date   NO PAST SURGERIES      Family History  Problem Relation Age of Onset   Healthy Mother    Healthy Father      Exam  BP 106/68   Pulse 76   Wt 151 lb (68.5 kg)   LMP  (LMP Unknown)   BMI 28.53 kg/m   Chaperone present during exam  CONSTITUTIONAL: Well-developed, well-nourished female in no acute distress.  HENT:  Normocephalic, atraumatic, External right and left ear normal.  Oropharynx is clear and moist EYES: Conjunctivae and EOM are normal. Pupils are equal, round, and reactive to light. No scleral icterus.  CARDIOVASCULAR: Normal heart rate noted, regular rhythm RESPIRATORY: Clear to auscultation bilaterally. Effort and breath sounds normal, no problems with respiration noted. ABDOMEN: Soft, normal bowel sounds, no distention noted.  No tenderness, rebound or guarding.  PELVIC: Normal appearing external genitalia; normal appearing vaginal mucosa and cervix. No abnormal discharge noted. Normal uterine size, no other palpable masses, no uterine or adnexal tenderness. MUSCULOSKELETAL: Normal range of motion. No tenderness.  No cyanosis, clubbing, or edema.  2+ distal pulses. SKIN: Skin is warm and dry. No rash noted. Not diaphoretic. No erythema. No pallor. NEUROLOGIC: Alert and oriented to person, place, and time. Normal reflexes, muscle tone coordination. No cranial nerve deficit noted. PSYCHIATRIC: Normal mood and affect. Normal behavior. Normal judgment and thought content.  Last 84.6 (04/2022) Impression  Insufficient prenatal care.  Fetal growth is appropriate for gestational age.  Amniotic fluid  is normal and good fetal activity seen.  Pressure today at her  office is 107/61 mmHg.   Assessment:    Pregnancy: 05/2022 Patient Active Problem List  Diagnosis Date Noted   GBS (group B Streptococcus carrier), +RV culture, currently pregnant 08/26/2020   Abnormal antibody titer 07/19/2020   Supervision of other normal pregnancy, antepartum 06/21/2020      Plan:   1. Supervision of other normal pregnancy, antepartum Third trimester labs collected Collected GBS today given multiparous status and prior GBS positivity  - Cervicovaginal ancillary only( [place]) - Cytology - PAP( [place]) - CBC - RPR - HIV antibody (with reflex) - Panorama Prenatal Test Full Panel - Strep Gp B NAA  2. Insufficient prenatal care in third trimester Reports  feeling supported at home Testing collected today     Initial labs obtained Continue prenatal vitamins Encouraged well-balanced diet Genetic & carrier screening discussed: requests Panorama, requests [facility]  Ultrasound discussed; fetal survey: results reviewed The nature of [place] for [employer] with multiple MDs and other Advanced Practice Providers was explained to patient; also emphasized that fellows, residents, and students are part of our team.    Indications for ASA therapy (per uptodate) Pt > 35 weeks at time of PNN  Indications for early GDM screening  PNN at 31 weeks    Darliss Cheney, MD Minimally Invasive Gynecologic Surgery 05/19/22

## 2022-05-19 ENCOUNTER — Other Ambulatory Visit (HOSPITAL_COMMUNITY)
Admission: RE | Admit: 2022-05-19 | Discharge: 2022-05-19 | Disposition: A | Discharge status: Home / Self Care | Payer: Medicaid Other | Source: Ambulatory Visit | Attending: Obstetrics and Gynecology | Admitting: Obstetrics and Gynecology

## 2022-05-19 ENCOUNTER — Ambulatory Visit (INDEPENDENT_AMBULATORY_CARE_PROVIDER_SITE_OTHER): Payer: Medicaid Other | Admitting: Obstetrics and Gynecology

## 2022-05-19 ENCOUNTER — Encounter: Payer: Self-pay | Admitting: Obstetrics and Gynecology

## 2022-05-19 VITALS — BP 106/68 | HR 76 | Wt 151.0 lb

## 2022-05-19 DIAGNOSIS — Z3480 Encounter for supervision of other normal pregnancy, unspecified trimester: Secondary | ICD-10-CM | POA: Insufficient documentation

## 2022-05-19 DIAGNOSIS — Z3A35 35 weeks gestation of pregnancy: Secondary | ICD-10-CM

## 2022-05-19 DIAGNOSIS — Z3483 Encounter for supervision of other normal pregnancy, third trimester: Secondary | ICD-10-CM

## 2022-05-19 DIAGNOSIS — O0933 Supervision of pregnancy with insufficient antenatal care, third trimester: Secondary | ICD-10-CM | POA: Insufficient documentation

## 2022-05-19 NOTE — Progress Notes (Signed)
NOB is in the office, reports fetal movement, denies pain.

## 2022-05-20 LAB — CBC
Hematocrit: 34.6 % (ref 34.0–46.6)
Hemoglobin: 11.9 g/dL (ref 11.1–15.9)
MCH: 32.5 pg (ref 26.6–33.0)
MCHC: 34.4 g/dL (ref 31.5–35.7)
MCV: 95 fL (ref 79–97)
Platelets: 155 10*3/uL (ref 150–450)
RBC: 3.66 x10E6/uL — ABNORMAL LOW (ref 3.77–5.28)
RDW: 12.8 % (ref 11.7–15.4)
WBC: 4.1 10*3/uL (ref 3.4–10.8)

## 2022-05-20 LAB — RPR: RPR Ser Ql: NONREACTIVE

## 2022-05-20 LAB — HIV ANTIBODY (ROUTINE TESTING W REFLEX): HIV Screen 4th Generation wRfx: NONREACTIVE

## 2022-05-21 LAB — STREP GP B NAA: Strep Gp B NAA: POSITIVE — AB

## 2022-05-22 LAB — CERVICOVAGINAL ANCILLARY ONLY
Bacterial Vaginitis (gardnerella): POSITIVE — AB
Candida Glabrata: NEGATIVE
Candida Vaginitis: NEGATIVE
Chlamydia: NEGATIVE
Comment: NEGATIVE
Comment: NEGATIVE
Comment: NEGATIVE
Comment: NEGATIVE
Comment: NEGATIVE
Comment: NORMAL
Neisseria Gonorrhea: NEGATIVE
Trichomonas: NEGATIVE

## 2022-05-23 LAB — CYTOLOGY - PAP
Comment: NEGATIVE
Diagnosis: NEGATIVE
High risk HPV: NEGATIVE

## 2022-05-24 LAB — PANORAMA PRENATAL TEST FULL PANEL:PANORAMA TEST PLUS 5 ADDITIONAL MICRODELETIONS: FETAL FRACTION: 17.5

## 2022-05-25 ENCOUNTER — Other Ambulatory Visit: Payer: Self-pay | Admitting: Obstetrics and Gynecology

## 2022-05-25 DIAGNOSIS — B9689 Other specified bacterial agents as the cause of diseases classified elsewhere: Secondary | ICD-10-CM

## 2022-05-25 MED ORDER — METRONIDAZOLE 0.75 % VA GEL: 1.0000 | Freq: Every day | VAGINAL | 1 refills | Status: DC

## 2022-05-26 ENCOUNTER — Ambulatory Visit (INDEPENDENT_AMBULATORY_CARE_PROVIDER_SITE_OTHER): Payer: Medicaid Other | Admitting: Student

## 2022-05-26 ENCOUNTER — Other Ambulatory Visit (HOSPITAL_COMMUNITY)
Admission: RE | Admit: 2022-05-26 | Discharge: 2022-05-26 | Disposition: A | Discharge status: Home / Self Care | Payer: Medicaid Other | Source: Ambulatory Visit | Attending: Student | Admitting: Student

## 2022-05-26 ENCOUNTER — Encounter: Payer: Self-pay | Admitting: Student

## 2022-05-26 VITALS — BP 103/65 | HR 81 | Wt 156.7 lb

## 2022-05-26 DIAGNOSIS — Z3A36 36 weeks gestation of pregnancy: Secondary | ICD-10-CM | POA: Insufficient documentation

## 2022-05-26 DIAGNOSIS — Z3480 Encounter for supervision of other normal pregnancy, unspecified trimester: Secondary | ICD-10-CM

## 2022-05-26 DIAGNOSIS — O0933 Supervision of pregnancy with insufficient antenatal care, third trimester: Secondary | ICD-10-CM

## 2022-05-26 DIAGNOSIS — O99820 Streptococcus B carrier state complicating pregnancy: Secondary | ICD-10-CM

## 2022-05-26 NOTE — Progress Notes (Signed)
   PRENATAL VISIT NOTE  Subjective:  Sierra Thomas is a 31 y.o. G3P2002 at [redacted]w[redacted]d being seen today for ongoing prenatal care.  She is currently monitored for the following issues for this low-risk pregnancy and has Abnormal antibody titer; Supervision of other normal pregnancy, antepartum; and Insufficient prenatal care in third trimester on their problem list.  Patient reports no complaints.  Contractions: Not present. Vag. Bleeding: None.  Movement: Present. Denies leaking of fluid.   The following portions of the patient's history were reviewed and updated as appropriate: allergies, current medications, past family history, past medical history, past social history, past surgical history and problem list.   Objective:   Vitals:   05/26/22 0922  BP: 103/65  Pulse: 81  Weight: 156 lb 11.2 oz (71.1 kg)    Fetal Status: Fetal Heart Rate (bpm): 134 Fundal Height: 36 cm Movement: Present     General:  Alert, oriented and cooperative. Patient is in no acute distress.  Skin: Skin is warm and dry. No rash noted.   Cardiovascular: Normal heart rate noted  Respiratory: Normal respiratory effort, no problems with respiration noted  Abdomen: Soft, gravid, appropriate for gestational age.  Pain/Pressure: Absent     Pelvic: Cervical exam deferred        Extremities: Normal range of motion.  Edema: None  Mental Status: Normal mood and affect. Normal behavior. Normal judgment and thought content.   Assessment and Plan:  Pregnancy: G3P2002 at [redacted]w[redacted]d 1. Supervision of other normal pregnancy, antepartum - Doing well, reassuring exam, vigorous fetal movement - Cervicovaginal ancillary only  2. [redacted] weeks gestation of pregnancy - Discussed different options for contraception. Reviewed all forms of birth control options available in office such as, hormonal contraceptive medication including pill, patch, ring, injection,contraceptive implant; hormonal and nonhormonal IUDs. Risks and benefits  reviewed.  Questions were answered.  Information was given to patient to review. Patient is undecided. Plan to review informational handouts.  - Cervicovaginal ancillary only  3. GBS (group B Streptococcus carrier), +RV culture, currently pregnant - Treatment in labor  4. Limited prenatal care in third trimester - Denies any needs at this time  Preterm labor symptoms and general obstetric precautions including but not limited to vaginal bleeding, contractions, leaking of fluid and fetal movement were reviewed in detail with the patient. [facility] needs.  Please refer to After Visit Summary for other counseling recommendations.   No follow-ups on file.  Future Appointments  Date Time Provider [place]  06/02/2022  9:55 AM Constant, Vickii Chafe, MD [place] None    Johnston Ebbs, NP

## 2022-05-29 LAB — CERVICOVAGINAL ANCILLARY ONLY
Chlamydia: NEGATIVE
Comment: NEGATIVE
Comment: NEGATIVE
Comment: NORMAL
Neisseria Gonorrhea: NEGATIVE
Trichomonas: NEGATIVE

## 2022-06-02 ENCOUNTER — Ambulatory Visit (INDEPENDENT_AMBULATORY_CARE_PROVIDER_SITE_OTHER): Payer: Medicaid Other | Admitting: Obstetrics and Gynecology

## 2022-06-02 ENCOUNTER — Encounter: Payer: Self-pay | Admitting: Obstetrics and Gynecology

## 2022-06-02 VITALS — BP 116/72 | Wt 155.0 lb

## 2022-06-02 DIAGNOSIS — Z3483 Encounter for supervision of other normal pregnancy, third trimester: Secondary | ICD-10-CM

## 2022-06-02 DIAGNOSIS — Z3480 Encounter for supervision of other normal pregnancy, unspecified trimester: Secondary | ICD-10-CM

## 2022-06-02 NOTE — Progress Notes (Signed)
Pt presents for ROB without complaints today.  

## 2022-06-02 NOTE — Progress Notes (Signed)
   PRENATAL VISIT NOTE  Subjective:  Sierra Thomas is a 31 y.o. G3P2002 at [redacted]w[redacted]d being seen today for ongoing prenatal care.  She is currently monitored for the following issues for this low-risk pregnancy and has Abnormal antibody titer; Supervision of other normal pregnancy, antepartum; and Insufficient prenatal care in third trimester on their problem list.  Patient reports no complaints.  Contractions: Not present. Vag. Bleeding: None.  Movement: Present. Denies leaking of fluid.   The following portions of the patient's history were reviewed and updated as appropriate: allergies, current medications, past family history, past medical history, past social history, past surgical history and problem list.   Objective:   Vitals:   06/02/22 1002  BP: 116/72  Weight: 155 lb (70.3 kg)    Fetal Status:   Fundal Height: 38 cm Movement: Present     General:  Alert, oriented and cooperative. Patient is in no acute distress.  Skin: Skin is warm and dry. No rash noted.   Cardiovascular: Normal heart rate noted  Respiratory: Normal respiratory effort, no problems with respiration noted  Abdomen: Soft, gravid, appropriate for gestational age.  Pain/Pressure: Absent     Pelvic: Cervical exam deferred        Extremities: Normal range of motion.     Mental Status: Normal mood and affect. Normal behavior. Normal judgment and thought content.   Assessment and Plan:  Pregnancy: G3P2002 at [redacted]w[redacted]d 1. Supervision of other normal pregnancy, antepartum Patient is doing well without complaints A1C and third trimester labs today   Term labor symptoms and general obstetric precautions including but not limited to vaginal bleeding, contractions, leaking of fluid and fetal movement were reviewed in detail with the patient. Please refer to After Visit Summary for other counseling recommendations.   Return in about 1 week (around 06/09/2022) for in person, ROB, Low risk.  No future  appointments.  Mora Bellman, MD

## 2022-06-05 LAB — CBC
Hematocrit: 34.5 % (ref 34.0–46.6)
Hemoglobin: 11.7 g/dL (ref 11.1–15.9)
MCH: 32.4 pg (ref 26.6–33.0)
MCHC: 33.9 g/dL (ref 31.5–35.7)
MCV: 96 fL (ref 79–97)
Platelets: 150 10*3/uL (ref 150–450)
RBC: 3.61 x10E6/uL — ABNORMAL LOW (ref 3.77–5.28)
RDW: 13 % (ref 11.7–15.4)
WBC: 4.8 10*3/uL (ref 3.4–10.8)

## 2022-06-05 LAB — RPR: RPR Ser Ql: NONREACTIVE

## 2022-06-05 LAB — HEMOGLOBIN A1C
Est. average glucose Bld gHb Est-mCnc: <74 mg/dL
Hgb A1c MFr Bld: <4.2 % — ABNORMAL LOW (ref 4.8–5.6)

## 2022-06-05 LAB — HIV ANTIBODY (ROUTINE TESTING W REFLEX): HIV Screen 4th Generation wRfx: NONREACTIVE

## 2022-06-14 ENCOUNTER — Ambulatory Visit (INDEPENDENT_AMBULATORY_CARE_PROVIDER_SITE_OTHER): Payer: Medicaid Other | Admitting: Advanced Practice Midwife

## 2022-06-14 VITALS — BP 101/62 | HR 84 | Wt 161.6 lb

## 2022-06-14 DIAGNOSIS — Z3A39 39 weeks gestation of pregnancy: Secondary | ICD-10-CM

## 2022-06-14 DIAGNOSIS — Z3480 Encounter for supervision of other normal pregnancy, unspecified trimester: Secondary | ICD-10-CM

## 2022-06-14 DIAGNOSIS — R760 Raised antibody titer: Secondary | ICD-10-CM

## 2022-06-14 DIAGNOSIS — Z3483 Encounter for supervision of other normal pregnancy, third trimester: Secondary | ICD-10-CM

## 2022-06-14 DIAGNOSIS — O99820 Streptococcus B carrier state complicating pregnancy: Secondary | ICD-10-CM

## 2022-06-14 MED ORDER — PRENATAL VITAMINS 28-0.8 MG PO TABS: 1.0000 | ORAL_TABLET | Freq: Every day | ORAL | 5 refills | Status: DC

## 2022-06-14 NOTE — Progress Notes (Signed)
Patient presents for ROB. Patient has no concerns today. 

## 2022-06-14 NOTE — Progress Notes (Signed)
   PRENATAL VISIT NOTE  Subjective:  Sierra Thomas is a 31 y.o. G3P2002 at [redacted]w[redacted]d being seen today for ongoing prenatal care.  She is currently monitored for the following issues for this low-risk pregnancy and has Abnormal antibody titer; Supervision of other normal pregnancy, antepartum; and Insufficient prenatal care in third trimester on their problem list.  Patient reports no complaints.  Contractions: Not present. Vag. Bleeding: None.  Movement: Present. Denies leaking of fluid.   The following portions of the patient's history were reviewed and updated as appropriate: allergies, current medications, past family history, past medical history, past social history, past surgical history and problem list.   Objective:   Vitals:   06/14/22 1515  BP: 101/62  Pulse: 84  Weight: 161 lb 9.6 oz (73.3 kg)    Fetal Status: Fetal Heart Rate (bpm): 135 Fundal Height: 38 cm Movement: Present     General:  Alert, oriented and cooperative. Patient is in no acute distress.  Skin: Skin is warm and dry. No rash noted.   Cardiovascular: Normal heart rate noted  Respiratory: Normal respiratory effort, no problems with respiration noted  Abdomen: Soft, gravid, appropriate for gestational age.  Pain/Pressure: Absent     Pelvic: Cervical exam deferred        Extremities: Normal range of motion.  Edema: None  Mental Status: Normal mood and affect. Normal behavior. Normal judgment and thought content.   Assessment and Plan:  Pregnancy: G3P2002 at [redacted]w[redacted]d 1. Abnormal antibody titer --Too weak to identify, unchanged since 2021, likely clinically insignificant.  Reviewed today with Dr Macon Large, will recheck on hospital admission and address if follow up is needed.  2. Supervision of other normal pregnancy, antepartum --Anticipatory guidance about next visits/weeks of pregnancy given.  --Pt does not desires exam or membrane sweep today, usually goes overdue with her babies --Is Ok with IOl at 41  weeks, but does not desire sooner unless medically indicated  3. GBS (group B Streptococcus carrier), +RV culture, currently pregnant --Prophylaxis in labor, discussed with pt today and questions answered  Term labor symptoms and general obstetric precautions including but not limited to vaginal bleeding, contractions, leaking of fluid and fetal movement were reviewed in detail with the patient. Please refer to After Visit Summary for other counseling recommendations.   Return in about 1 week (around 06/21/2022) for With NST.  No future appointments.  Sharen Counter, CNM

## 2022-06-20 ENCOUNTER — Encounter: Payer: Self-pay | Admitting: Advanced Practice Midwife

## 2022-06-20 ENCOUNTER — Ambulatory Visit (INDEPENDENT_AMBULATORY_CARE_PROVIDER_SITE_OTHER): Payer: Medicaid Other | Admitting: Advanced Practice Midwife

## 2022-06-20 VITALS — BP 109/66 | HR 76 | Wt 161.8 lb

## 2022-06-20 DIAGNOSIS — Z3A40 40 weeks gestation of pregnancy: Secondary | ICD-10-CM

## 2022-06-20 DIAGNOSIS — O480 Post-term pregnancy: Secondary | ICD-10-CM | POA: Diagnosis not present

## 2022-06-20 DIAGNOSIS — O99820 Streptococcus B carrier state complicating pregnancy: Secondary | ICD-10-CM

## 2022-06-20 DIAGNOSIS — Z3480 Encounter for supervision of other normal pregnancy, unspecified trimester: Secondary | ICD-10-CM

## 2022-06-20 NOTE — Progress Notes (Signed)
Pt presents for ROB visit. No concerns at this time.  

## 2022-06-20 NOTE — Progress Notes (Signed)
   PRENATAL VISIT NOTE  Subjective:  Sierra Thomas is a 31 y.o. G3P2002 at [redacted]w[redacted]d being seen today for ongoing prenatal care.  She is currently monitored for the following issues for this low-risk pregnancy and has Abnormal antibody titer; Supervision of other normal pregnancy, antepartum; and Insufficient prenatal care in third trimester on their problem list.  Patient reports no complaints.  Contractions: Not present. Vag. Bleeding: None.  Movement: Present. Denies leaking of fluid.   The following portions of the patient's history were reviewed and updated as appropriate: allergies, current medications, past family history, past medical history, past social history, past surgical history and problem list.   Objective:   Vitals:   06/20/22 1345  BP: 109/66  Pulse: 76  Weight: 161 lb 12.8 oz (73.4 kg)    Fetal Status: Fetal Heart Rate (bpm): 127 Fundal Height: 39 cm Movement: Present     General:  Alert, oriented and cooperative. Patient is in no acute distress.  Skin: Skin is warm and dry. No rash noted.   Cardiovascular: Normal heart rate noted  Respiratory: Normal respiratory effort, no problems with respiration noted  Abdomen: Soft, gravid, appropriate for gestational age.  Pain/Pressure: Absent     Pelvic: Cervical exam performed in the presence of a chaperone Dilation: 1 Effacement (%): 0 Station: -3  Extremities: Normal range of motion.  Edema: None  Mental Status: Normal mood and affect. Normal behavior. Normal judgment and thought content.   Assessment and Plan:  Pregnancy: G3P2002 at [redacted]w[redacted]d 1. Supervision of other normal pregnancy, antepartum --Anticipatory guidance about next visits/weeks of pregnancy given.   2. GBS (group B Streptococcus carrier), +RV culture, currently pregnant --Prophylaxis in labor  3. [redacted] weeks gestation of pregnancy --NST reactive today, pt feeling good fetal movement --IOL at 41 weeks, discussed and scheduled with shared decision  making --Membranes swept today, cervix 1/long/-3   Term labor symptoms and general obstetric precautions including but not limited to vaginal bleeding, contractions, leaking of fluid and fetal movement were reviewed in detail with the patient. Please refer to After Visit Summary for other counseling recommendations.   Return for No further prenatal appt, IOL on 4/27.  Future Appointments  Date Time Provider Department Center  06/26/2022  6:45 AM [facility] SCHED ROOM [facility] None    Sharen Counter, CNM

## 2022-06-21 ENCOUNTER — Telehealth (HOSPITAL_COMMUNITY): Payer: Self-pay | Admitting: *Deleted

## 2022-06-21 NOTE — Telephone Encounter (Signed)
[id number] interpreter number Preadmission screen

## 2022-06-23 ENCOUNTER — Inpatient Hospital Stay (HOSPITAL_COMMUNITY)
Admission: AD | Admit: 2022-06-23 | Discharge: 2022-06-24 | DRG: 807 | Disposition: A | Discharge status: Home / Self Care | Payer: Medicaid Other | Attending: Obstetrics and Gynecology | Admitting: Obstetrics and Gynecology

## 2022-06-23 ENCOUNTER — Encounter (HOSPITAL_COMMUNITY): Payer: Self-pay | Admitting: Obstetrics & Gynecology

## 2022-06-23 ENCOUNTER — Other Ambulatory Visit: Payer: Self-pay

## 2022-06-23 DIAGNOSIS — Z3A40 40 weeks gestation of pregnancy: Secondary | ICD-10-CM

## 2022-06-23 DIAGNOSIS — O99824 Streptococcus B carrier state complicating childbirth: Secondary | ICD-10-CM | POA: Diagnosis present

## 2022-06-23 DIAGNOSIS — O480 Post-term pregnancy: Secondary | ICD-10-CM | POA: Diagnosis present

## 2022-06-23 DIAGNOSIS — O99820 Streptococcus B carrier state complicating pregnancy: Secondary | ICD-10-CM | POA: Diagnosis not present

## 2022-06-23 DIAGNOSIS — Z3480 Encounter for supervision of other normal pregnancy, unspecified trimester: Secondary | ICD-10-CM

## 2022-06-23 LAB — CBC
HCT: 37.5 % (ref 36.0–46.0)
Hemoglobin: 13.3 g/dL (ref 12.0–15.0)
MCH: 32.8 pg (ref 26.0–34.0)
MCHC: 35.5 g/dL (ref 30.0–36.0)
MCV: 92.6 fL (ref 80.0–100.0)
Platelets: 183 10*3/uL (ref 150–400)
RBC: 4.05 MIL/uL (ref 3.87–5.11)
RDW: 13.2 % (ref 11.5–15.5)
WBC: 6.4 10*3/uL (ref 4.0–10.5)
nRBC: 0 % (ref 0.0–0.2)

## 2022-06-23 LAB — RPR: RPR Ser Ql: NONREACTIVE

## 2022-06-23 MED ORDER — DIBUCAINE (PERIANAL) 1 % EX OINT: 1.0000 | TOPICAL_OINTMENT | CUTANEOUS | Status: DC | PRN

## 2022-06-23 MED ORDER — DIPHENHYDRAMINE HCL 25 MG PO CAPS: 25.0000 mg | ORAL_CAPSULE | Freq: Four times a day (QID) | ORAL | Status: DC | PRN

## 2022-06-23 MED ORDER — SOD CITRATE-CITRIC ACID 500-334 MG/5ML PO SOLN: 30.0000 mL | ORAL | Status: DC | PRN

## 2022-06-23 MED ORDER — OXYTOCIN-SODIUM CHLORIDE 30-0.9 UT/500ML-% IV SOLN: 2.5000 [IU]/h | INTRAVENOUS | Status: DC

## 2022-06-23 MED ORDER — PRENATAL MULTIVITAMIN CH
1.0000 | ORAL_TABLET | Freq: Every day | ORAL | Status: DC
  Administered 2022-06-24: 1 via ORAL
  Filled 2022-06-23: qty 1

## 2022-06-23 MED ORDER — ACETAMINOPHEN 325 MG PO TABS
650.0000 mg | ORAL_TABLET | ORAL | Status: DC | PRN
  Administered 2022-06-23: 650 mg via ORAL
  Filled 2022-06-23: qty 2

## 2022-06-23 MED ORDER — COCONUT OIL OIL: 1.0000 | TOPICAL_OIL | Status: DC | PRN

## 2022-06-23 MED ORDER — LIDOCAINE HCL (PF) 1 % IJ SOLN: 30.0000 mL | INTRAMUSCULAR | Status: DC | PRN

## 2022-06-23 MED ORDER — SODIUM CHLORIDE 0.9 % IV SOLN
2.0000 g | Freq: Once | INTRAVENOUS | Status: AC
  Administered 2022-06-23: 2 g via INTRAVENOUS
  Filled 2022-06-23: qty 2000

## 2022-06-23 MED ORDER — LACTATED RINGERS IV SOLN: 500.0000 mL | INTRAVENOUS | Status: DC | PRN

## 2022-06-23 MED ORDER — OXYCODONE-ACETAMINOPHEN 5-325 MG PO TABS: 1.0000 | ORAL_TABLET | ORAL | Status: DC | PRN

## 2022-06-23 MED ORDER — ONDANSETRON HCL 4 MG/2ML IJ SOLN: 4.0000 mg | INTRAMUSCULAR | Status: DC | PRN

## 2022-06-23 MED ORDER — ONDANSETRON HCL 4 MG/2ML IJ SOLN: 4.0000 mg | Freq: Four times a day (QID) | INTRAMUSCULAR | Status: DC | PRN

## 2022-06-23 MED ORDER — WITCH HAZEL-GLYCERIN EX PADS: 1.0000 | MEDICATED_PAD | CUTANEOUS | Status: DC | PRN

## 2022-06-23 MED ORDER — IBUPROFEN 600 MG PO TABS
600.0000 mg | ORAL_TABLET | Freq: Four times a day (QID) | ORAL | Status: DC
  Administered 2022-06-23 – 2022-06-24 (×5): 600 mg via ORAL
  Filled 2022-06-23 (×5): qty 1

## 2022-06-23 MED ORDER — OXYCODONE-ACETAMINOPHEN 5-325 MG PO TABS: 2.0000 | ORAL_TABLET | ORAL | Status: DC | PRN

## 2022-06-23 MED ORDER — SENNOSIDES-DOCUSATE SODIUM 8.6-50 MG PO TABS
2.0000 | ORAL_TABLET | Freq: Every day | ORAL | Status: DC
  Administered 2022-06-24: 2 via ORAL
  Filled 2022-06-23: qty 2

## 2022-06-23 MED ORDER — ACETAMINOPHEN 325 MG PO TABS: 650.0000 mg | ORAL_TABLET | ORAL | Status: DC | PRN

## 2022-06-23 MED ORDER — MISOPROSTOL 50MCG HALF TABLET: 50.0000 ug | ORAL_TABLET | Freq: Once | ORAL | Status: DC

## 2022-06-23 MED ORDER — BENZOCAINE-MENTHOL 20-0.5 % EX AERO: 1.0000 | INHALATION_SPRAY | CUTANEOUS | Status: DC | PRN

## 2022-06-23 MED ORDER — TERBUTALINE SULFATE 1 MG/ML IJ SOLN: 0.2500 mg | Freq: Once | INTRAMUSCULAR | Status: DC | PRN

## 2022-06-23 MED ORDER — OXYTOCIN BOLUS FROM INFUSION
333.0000 mL | Freq: Once | INTRAVENOUS | Status: AC
  Administered 2022-06-23: 333 mL via INTRAVENOUS

## 2022-06-23 MED ORDER — PENICILLIN G POT IN DEXTROSE 60000 UNIT/ML IV SOLN: 3.0000 10*6.[IU] | INTRAVENOUS | Status: DC

## 2022-06-23 MED ORDER — LACTATED RINGERS IV SOLN: INTRAVENOUS | Status: DC

## 2022-06-23 MED ORDER — TETANUS-DIPHTH-ACELL PERTUSSIS 5-2.5-18.5 LF-MCG/0.5 IM SUSY: 0.5000 mL | PREFILLED_SYRINGE | Freq: Once | INTRAMUSCULAR | Status: DC

## 2022-06-23 MED ORDER — SIMETHICONE 80 MG PO CHEW: 80.0000 mg | CHEWABLE_TABLET | ORAL | Status: DC | PRN

## 2022-06-23 MED ORDER — OXYTOCIN BOLUS FROM INFUSION: 333.0000 mL | Freq: Once | INTRAVENOUS | Status: DC

## 2022-06-23 MED ORDER — ONDANSETRON HCL 4 MG PO TABS: 4.0000 mg | ORAL_TABLET | ORAL | Status: DC | PRN

## 2022-06-23 MED ORDER — SODIUM CHLORIDE 0.9 % IV SOLN: 1.0000 g | INTRAVENOUS | Status: DC

## 2022-06-23 MED ORDER — MISOPROSTOL 25 MCG QUARTER TABLET: 25.0000 ug | ORAL_TABLET | Freq: Once | ORAL | Status: DC

## 2022-06-23 MED ORDER — SODIUM CHLORIDE 0.9 % IV SOLN: 5.0000 10*6.[IU] | Freq: Once | INTRAVENOUS | Status: DC

## 2022-06-23 MED ORDER — OXYTOCIN-SODIUM CHLORIDE 30-0.9 UT/500ML-% IV SOLN
2.5000 [IU]/h | INTRAVENOUS | Status: DC
  Filled 2022-06-23: qty 500

## 2022-06-23 NOTE — MAU Note (Signed)
.  Sierra Thomas is a 31 y.o. at [redacted]w[redacted]d here in MAU reporting: ctx since yesterday, worsen around MN. Pt reports mucus discharge and bloody show. Pt denies VB, DFM, LOF, PIH s/s, and complications in the pregnancy.  GBS pos Late to [facility]  Onset of complaint: 0000 Pain score: 8/10 back There were no vitals filed for this visit.    Lab orders placed from triage:

## 2022-06-23 NOTE — H&P (Addendum)
OBSTETRIC ADMISSION HISTORY AND PHYSICAL  Sierra Thomas is a 31 y.o. female G3P2002 with IUP at [redacted]w[redacted]d by 30 wk [place] presenting for SOL. She reports +FMs, No LOF, no VB, no blurry vision, headaches or peripheral edema, and RUQ pain.  She plans on breast and bottle feeding. She is undecided for birth control. She received her prenatal care at  [facility]    Dating: By 30 wk [place] --->  Estimated Date of Delivery: 06/18/22  Sono:    @[redacted]w[redacted]d , CWD, normal anatomy, cephalic presentation, fundal placental lie, 2513g, 55% EFW   Prenatal History/Complications:  -Late to pnc @35  wks -GBS+   Past Medical History: Past Medical History:  Diagnosis Date   Anemia    Anemia 01/10/2019   CBC Latest Ref Rng & Units 06/21/2020 01/11/2019 01/10/2019 WBC 3.4 - 10.8 x10E3/uL 4.4 9.6 6.9 Hemoglobin 11.1 - 15.9 g/dL 01/13/2019 10.7(L) 11.8(L) Hematocrit 34.0 - 46.6 % 34.5 32.7(L) 35.6(L) Platelets 150 - 450 x10E3/uL 150 228 220     Past Surgical History: Past Surgical History:  Procedure Laterality Date   NO PAST SURGERIES      Obstetrical History: OB History     Gravida  3   Para  2   Term  2   Preterm      AB      Living  2      SAB      IAB      Ectopic      Multiple  0   Live Births  2           Social History Social History   Socioeconomic History   Marital status: Married    Spouse name: Not on file   Number of children: Not on file   Years of education: Not on file   Highest education level: Not on file  Occupational History   Not on file  Tobacco Use   Smoking status: Never   Smokeless tobacco: Never  Vaping Use   Vaping Use: Never used  Substance and Sexual Activity   Alcohol use: Never   Drug use: Never   Sexual activity: Yes    Partners: Male  Other Topics Concern   Not on file  Social History Narrative   Works at [employer]2019. Education: high 45.8.    Social Determinants of Health   Financial Resource Strain: Not on file  Food  Insecurity: Not on file  Transportation Needs: Not on file  Physical Activity: Not on file  Stress: Not on file  Social Connections: Not on file    Family History: Family History  Problem Relation Age of Onset   Healthy Mother    Healthy Father     Allergies: No Known Allergies  Medications Prior to Admission  Medication Sig Dispense Refill Last Dose   Prenatal Vit-Fe Fumarate-FA (PRENATAL VITAMINS) 28-0.8 MG TABS Take 1 tablet by mouth daily. 30 tablet 5 06/23/2022     Review of Systems   All systems reviewed and negative except as stated in HPI  Blood pressure 109/73, pulse 70, temperature 99.1 F (37.3 C), temperature source Oral, resp. rate (!) 85, SpO2 100 %, currently breastfeeding. General appearance: alert, appears older than stated age, and moderate distress Lungs: normal effort Heart: regular rate noted Abdomen: gravid Pelvic: See below Extremities: No LE edema Presentation: cephalic Fetal monitoringBaseline: 125 bpm, Variability: Good {> 6 bpm), Accelerations: Reactive, and Decelerations: Early Uterine activityFrequency: Every 1-2 minutes Dilation: 9 Effacement (%): 80 Station: -2  Exam by:: Dr. Salvadore Dom   Prenatal labs: ABO, Rh: O/Positive/-- (08/16 1545) Antibody: Positive, See Final Results (08/16 1545) Rubella: 5.83 (08/16 1545) RPR: Non Reactive (11/03 1021)  HBsAg: Negative (08/16 1545)  HIV: Non Reactive (11/03 1021)  GBS: Positive/-- (10/20 1105)  1 hr Glucola A1C 4.2 Genetic screening  wnl Anatomy US wnl  Prenatal Transfer Tool  Maternal Diabetes: No Genetic Screening: Normal Maternal Ultrasounds/Referrals: Normal Fetal Ultrasounds or other Referrals:  None Maternal Substance Abuse:  No Significant Maternal Medications:  None Significant Maternal Lab Results:  Group B Strep positive Number of Prenatal Visits:greater than 3 verified prenatal visits Other Comments:   Late to care @ 35 weeks  No results found for this or any  previous visit (from the past 24 hour(s)).  Patient Active Problem List   Diagnosis Date Noted   Supervision of other normal pregnancy, antepartum 05/19/2022   Insufficient prenatal care in third trimester 05/19/2022   Abnormal antibody titer 07/19/2020    Assessment/Plan:  Sierra Thomas is a 31 y.o. G3P2002 at [redacted]w[redacted]d here for SOL.   #Labor: Expectant management. AROM bulging back s/p amp x1.  #Pain: Maternally supported #FWB: Cat I #ID:  GBS +, amp #MOF: Both #MOC:Undecided #Circ:  Yes, if boy, surprise gender  [name], DO  06/23/2022, 2:31 AM

## 2022-06-23 NOTE — Lactation Note (Signed)
This note was copied from a baby's chart. Lactation Consultation Note  Patient Name: Sierra Thomas [id number] Date: 06/23/2022 Reason for consult: Initial assessment;Term Age:32 hours  Initial visit to 5 hours old infant. Birthing parent states infant latched twice since birth and denies pain/discomfort. Demonstrated hand expression, unable to see colostrum. Provided hand pump for colostrum expression, birthing parent verbalizes discomfort. No latched attempted because infant was recently bottlefed formula.  Discussed normal newborn behavior and patterns, signs of good milk transfer, hunger cues, tummy size and benefits of skin to skin.  Provided formula feeding guidelines, and encouraged pacing, upright position and frequent burping.   Plan: 1-Feeding on demand or 8-12 times in 24h period. 2-Use manual pump as needed 3-Use formula following recommendations. 4-Encouraged birthing parent rest, hydration and food intake.   Contact LC as needed for feeds/support/concerns/questions. All questions answered at this time. Provided Lactation services brochure and other local resources.  Maternal Data Has patient been taught Hand Expression?: Yes Does the patient have breastfeeding experience prior to this delivery?: Yes How long did the patient breastfeed?: 14 months x2  Feeding Mother's Current Feeding Choice: Breast Milk and Formula  Lactation Tools Discussed/Used Tools: Pump;Flanges Flange Size: 24;27 Breast pump type: Manual Pump Education: Setup, frequency, and cleaning;Milk Storage Reason for Pumping: mother's request Pumping frequency: as needed Pumped volume:  (verbalized discomfort with demonstration)  Interventions Interventions: Breast feeding basics reviewed;Skin to skin;Hand express;Breast massage;Hand pump;Expressed milk;Education;Pace feeding;LC Services brochure  Discharge Pump: Manual WIC Program: Yes  Consult Status Consult Status: Follow-up Date:  06/24/22 Follow-up type: In-patient    Sierra Thomas 06/23/2022, 11:19 AM

## 2022-06-23 NOTE — Discharge Summary (Signed)
Postpartum Discharge Summary     Patient Name: Sierra Thomas DOB: 09-13-1990 MRN: [id number]  Date of admission: 06/23/2022 Delivery date:06/23/2022  Delivering provider: Gerlene Fee  Date of discharge: 06/24/2022  Admitting diagnosis: Normal labor [O80, Z37.9] Indication for care in labor and delivery, antepartum [O75.9] Intrauterine pregnancy: [redacted]w[redacted]d    Secondary diagnosis:  Principal Problem:   Normal labor Active Problems:   Vaginal delivery   Indication for care in labor and delivery, antepartum  Additional problems: None    Discharge diagnosis: Term Pregnancy Delivered                                              Post partum procedures: None Augmentation: AROM Complications: None  Hospital course: Onset of Labor With Vaginal Delivery      31y.o. yo G3P2002 at 428w5das admitted in Active Labor on 06/23/2022. Labor course was uncomplicated.   Membrane Rupture Time/Date: 3:25 AM ,06/23/2022   Delivery Method:Vaginal, Spontaneous  Episiotomy: None  Lacerations:  None  Patient had a uncomplicated.  She is ambulating, tolerating a regular diet, passing flatus, and urinating well. Patient is discharged home in stable condition on 06/24/22.  Newborn Data: Birth date:06/23/2022  Birth time:6:01 AM  Gender:Female  Living status:Living  Apgars:8 ,9  Weight:3600 g   Magnesium Sulfate received: No BMZ received: No Rhophylac:No MMR:No T-DaP:Given postpartum Flu: N/A Transfusion:No  Physical exam  Vitals:   06/23/22 1405 06/23/22 1740 06/23/22 2110 06/24/22 0526  BP: 105/61 102/64 102/69 99/62  Pulse: 79 73 73 73  Resp: _0 Temp: 97.9 F (36.6 C) 98.1 F (36.7 C) 97.9 F (36.6 C) 97.7 F (36.5 C)  TempSrc: Oral Oral Oral   SpO2: 100% 99% 99% 100%  Weight:      Height:       General: alert, cooperative, and no distress Lochia: appropriate Uterine Fundus: firm Incision: N/A DVT Evaluation: No evidence of DVT seen on physical  exam. Labs: Lab Results  Component Value Date   WBC 7.3 06/24/2022   HGB 11.0 (L) 06/24/2022   HCT 32.2 (L) 06/24/2022   MCV 95.0 06/24/2022   PLT 163 06/24/2022       No data to display         Edinburgh Score:    06/23/2022    9:10 PM  Edinburgh Postnatal Depression Scale Screening Tool  I have been able to laugh and see the funny side of things. 0  I have looked forward with enjoyment to things. 0  I have blamed myself unnecessarily when things went wrong. 0  I have been anxious or worried for no good reason. 0  I have felt scared or panicky for no good reason. 0  Things have been getting on top of me. 0  I have been so unhappy that I have had difficulty sleeping. 0  I have felt sad or miserable. 0  I have been so unhappy that I have been crying. 0  The thought of harming myself has occurred to me. 0  Edinburgh Postnatal Depression Scale Total 0     After visit meds:  Allergies as of 06/24/2022   No Known Allergies      Medication List     TAKE these medications    Prenatal Vitamins 28-0.8 MG Tabs Take 1 tablet by mouth daily.  Discharge home in stable condition Infant Feeding: Breast Infant Disposition:home with mother Discharge instruction: per After Visit Summary and Postpartum booklet. Activity: Advance as tolerated. Pelvic rest for 6 weeks.  Diet: routine diet Future Appointments: No future appointments.  Follow up Visit:  Message sent to [facility] by Autry-Lott on 06/24/2022  Please schedule this patient for a In person postpartum visit in 6 weeks with the following provider: Any provider. Additional Postpartum F/U: none   Low risk pregnancy complicated by:  LTC @ 35 weeks Delivery mode:  Vaginal, Spontaneous  Anticipated Birth Control:  Unsure   06/24/2022 Concepcion Living, MD

## 2022-06-24 LAB — CBC
HCT: 32.2 % — ABNORMAL LOW (ref 36.0–46.0)
Hemoglobin: 11 g/dL — ABNORMAL LOW (ref 12.0–15.0)
MCH: 32.4 pg (ref 26.0–34.0)
MCHC: 34.2 g/dL (ref 30.0–36.0)
MCV: 95 fL (ref 80.0–100.0)
Platelets: 163 10*3/uL (ref 150–400)
RBC: 3.39 MIL/uL — ABNORMAL LOW (ref 3.87–5.11)
RDW: 13.6 % (ref 11.5–15.5)
WBC: 7.3 10*3/uL (ref 4.0–10.5)
nRBC: 0 % (ref 0.0–0.2)

## 2022-06-24 MED ORDER — EPINEPHRINE TOPICAL FOR CIRCUMCISION 0.1 MG/ML: 1.0000 [drp] | TOPICAL | Status: DC | PRN

## 2022-06-24 MED ORDER — WHITE PETROLATUM EX OINT: 1.0000 | TOPICAL_OINTMENT | CUTANEOUS | Status: DC | PRN

## 2022-06-24 MED ORDER — SUCROSE 24% NICU/PEDS ORAL SOLUTION: 0.5000 mL | OROMUCOSAL | Status: DC | PRN

## 2022-06-24 MED ORDER — LIDOCAINE 1% INJECTION FOR CIRCUMCISION
0.8000 mL | INJECTION | Freq: Once | INTRAVENOUS | Status: DC
  Filled 2022-06-24: qty 1

## 2022-06-24 MED ORDER — GELATIN ABSORBABLE 12-7 MM EX MISC: 1.0000 | Freq: Once | CUTANEOUS | Status: DC | PRN

## 2022-06-24 NOTE — Progress Notes (Signed)
Circumcision Consent  Discussed with mom at bedside about circumcision.   Circumcision is a surgery that removes the skin that covers the tip of the penis, called the "foreskin." Circumcision is usually done when a boy is between 80 and 12 days old, sometimes up to 15-31 weeks old.  The most common reasons boys are circumcised include for cultural/religious beliefs or for parental preference (potentially easier to clean, so baby looks like daddy, etc).  There may be some medical benefits for circumcision:   Circumcised boys seem to have slightly lower rates of: ? Urinary tract infections (per the American Academy of Pediatrics an uncircumcised boy has a 1/100 chance of developing a UTI in the first year of life, a circumcised boy at a 07/998 chance of developing a UTI in the first year of life- a 10% reduction) ? Penis cancer (typically rare- an uncircumcised female has a 1 in 100,000 chance of developing cancer of the penis) ? Sexually transmitted infection (in endemic areas, including HIV, HPV and Herpes- circumcision does NOT protect against gonorrhea, chlamydia, trachomatis, or syphilis) ? Phimosis: a condition where that makes retraction of the foreskin over the glans impossible (0.4 per 1000 boys per year or 0.6% of boys are affected by their 15th birthday)  Boys and men who are not circumcised can reduce these extra risks by: ? Cleaning their penis well ? Using condoms during sex  What are the risks of circumcision?  As with any surgical procedure, there are risks and complications. In circumcision, complications are rare and usually minor, the most common being: ? Bleeding- risk is reduced by holding each clamp for 30 seconds prior to a cut being made, and by holding pressure after the procedure is done ? Infection- the penis is cleaned prior to the procedure, and the procedure is done under sterile technique ? Damage to the urethra or amputation of the penis  How is circumcision done  in baby boys?  The baby will be placed on a special table and the legs restrained for their safety. Numbing medication is injected into the penis, and the skin is cleansed with betadine to decrease the risk of infection.   What to expect:  The penis will look red and raw for 5-7 days as it heals. We expect scabbing around where the cut was made, as well as clear-pink fluid and some swelling of the penis right after the procedure. If your baby's circumcision starts to bleed or develops pus, please contact your pediatrician immediately.  All questions were answered and mother consented.  Celedonio Savage, MD 11:55 AM

## 2022-06-26 ENCOUNTER — Inpatient Hospital Stay (HOSPITAL_COMMUNITY): Payer: Medicaid Other

## 2022-06-26 LAB — TYPE AND SCREEN
ABO/RH(D): O POS
Antibody Screen: POSITIVE
Donor AG Type: NEGATIVE
Donor AG Type: NEGATIVE
Unit division: 0
Unit division: 0

## 2022-06-26 LAB — BPAM RBC
Blood Product Expiration Date: 202312142359
Blood Product Expiration Date: 202312172359
ISSUE DATE / TIME: 202311201923
Unit Type and Rh: 5100
Unit Type and Rh: 5100

## 2022-07-01 ENCOUNTER — Telehealth (HOSPITAL_COMMUNITY): Payer: Self-pay

## 2022-07-01 NOTE — Telephone Encounter (Signed)
Patient did not answer phone call. Voicemail left for patient.   Sierra Thomas [facility] 07/01/2022,0959

## 2022-07-03 ENCOUNTER — Emergency Department (HOSPITAL_COMMUNITY)
Admission: EM | Admit: 2022-07-03 | Discharge: 2022-07-03 | Disposition: A | Discharge status: Home / Self Care | Payer: Medicaid Other | Attending: Emergency Medicine | Admitting: Emergency Medicine

## 2022-07-03 ENCOUNTER — Encounter (HOSPITAL_COMMUNITY): Payer: Self-pay | Admitting: Emergency Medicine

## 2022-07-03 DIAGNOSIS — R21 Rash and other nonspecific skin eruption: Secondary | ICD-10-CM | POA: Diagnosis present

## 2022-07-03 DIAGNOSIS — L509 Urticaria, unspecified: Secondary | ICD-10-CM | POA: Insufficient documentation

## 2022-07-03 MED ORDER — PREDNISONE 10 MG PO TABS: 20.0000 mg | ORAL_TABLET | Freq: Every day | ORAL | 0 refills | Status: AC

## 2022-07-03 MED ORDER — PREDNISONE 20 MG PO TABS
20.0000 mg | ORAL_TABLET | Freq: Once | ORAL | Status: AC
  Administered 2022-07-03: 20 mg via ORAL
  Filled 2022-07-03: qty 1

## 2022-07-03 NOTE — Discharge Instructions (Signed)
Take next dose of steroid tomorrow.  Consider taking 25 mg of Benadryl every 8 hours as needed for itching as well.

## 2022-07-03 NOTE — ED Notes (Signed)
Reviewed discharge instructions with patient. Follow-up care and medications reviewed. Patient  verbalized understanding. Patient A&Ox4, VSS, and ambulatory with steady gait upon discharge.  °

## 2022-07-03 NOTE — ED Provider Notes (Signed)
[facility] EMERGENCY DEPARTMENT Provider Note   CSN: [id number] Arrival date & time: 07/03/22  3220     History  Chief Complaint  Patient presents with   Rash    Sierra Thomas is a 31 y.o. female.  Patient here with rash on her arms and legs.  Since yesterday.  Denies any changes in medication or exposure.  Has not tried anything to make it worse or better.  She denies any fevers or chills.  No abdominal pain nausea or vomiting.  No difficulty breathing.  No allergy history.  The history is provided by the patient.       Home Medications Prior to Admission medications   Medication Sig Start Date End Date Taking? Authorizing Provider  predniSONE (DELTASONE) 10 MG tablet Take 2 tablets (20 mg total) by mouth daily for 4 days. 07/03/22 07/07/22 Yes Abrielle Finck, DO  Prenatal Vit-Fe Fumarate-FA (PRENATAL VITAMINS) 28-0.8 MG TABS Take 1 tablet by mouth daily. 06/14/22   Leftwich-Kirby, Wilmer Floor, CNM      Allergies    Patient has no known allergies.    Review of Systems   Review of Systems  Physical Exam Updated Vital Signs BP 108/78   Pulse 71   Temp 97.8 F (36.6 C)   Resp 14   Ht 5\' 1"  (1.549 m)   Wt 73 kg   SpO2 98%   BMI 30.41 kg/m  Physical Exam Constitutional:      General: She is not in acute distress.    Appearance: She is not ill-appearing.  HENT:     Head: Normocephalic.     Nose: Nose normal. No congestion.     Mouth/Throat:     Mouth: Mucous membranes are moist.  Eyes:     Pupils: Pupils are equal, round, and reactive to light.  Cardiovascular:     Pulses: Normal pulses.  Pulmonary:     Effort: Pulmonary effort is normal.  Skin:    Findings: Rash present.     Comments: Hives on forearms  Neurological:     General: No focal deficit present.     Mental Status: She is alert.     ED Results / Procedures / Treatments   Labs (all labs ordered are listed, but only abnormal results are displayed) Labs Reviewed - No  data to display  EKG None  Radiology No results found.  Procedures Procedures    Medications Ordered in ED Medications  predniSONE (DELTASONE) tablet 20 mg (has no administration in time range)    ED Course/ Medical Decision Making/ A&P                           Medical Decision Making Risk Prescription drug management.   Alveta Quintela is here with rash.  Has hives to her forearms.  Does not have any signs to suggest anaphylaxis.  Normal vitals.  No fever.  Overall appears well.  Seems like idiopathic hives.  Will treat with prednisone.  Recommend Benadryl.  Discharged in good condition.  Understands return precautions.  This chart was dictated using voice recognition software.  Despite best efforts to proofread,  errors can occur which can change the documentation meaning.         Final Clinical Impression(s) / ED Diagnoses Final diagnoses:  Rash    Rx / DC Orders ED Discharge Orders          Ordered    predniSONE (DELTASONE) 10 MG  tablet  Daily        07/03/22 0919              Virgina Norfolk, DO 07/03/22 1444

## 2022-07-03 NOTE — ED Triage Notes (Signed)
Pt arrives POV with reports of rash all over her body since yesterday. Pt unsure of any new detergents or body wash. Denies any pain.

## 2022-07-22 ENCOUNTER — Emergency Department (HOSPITAL_COMMUNITY)
Admission: EM | Admit: 2022-07-22 | Discharge: 2022-07-22 | Discharge status: Against Medical Advice | Payer: Medicaid Other | Attending: Student | Admitting: Student

## 2022-07-22 ENCOUNTER — Encounter (HOSPITAL_COMMUNITY): Payer: Self-pay

## 2022-07-22 ENCOUNTER — Emergency Department (HOSPITAL_COMMUNITY): Payer: Medicaid Other

## 2022-07-22 ENCOUNTER — Other Ambulatory Visit: Payer: Self-pay

## 2022-07-22 ENCOUNTER — Encounter (HOSPITAL_COMMUNITY): Payer: Self-pay | Admitting: *Deleted

## 2022-07-22 ENCOUNTER — Emergency Department (HOSPITAL_COMMUNITY)
Admission: EM | Admit: 2022-07-22 | Discharge: 2022-07-22 | Disposition: A | Discharge status: Home / Self Care | Payer: Medicaid Other | Source: Home / Self Care | Attending: Emergency Medicine | Admitting: Emergency Medicine

## 2022-07-22 DIAGNOSIS — M7989 Other specified soft tissue disorders: Secondary | ICD-10-CM | POA: Insufficient documentation

## 2022-07-22 DIAGNOSIS — M79642 Pain in left hand: Secondary | ICD-10-CM | POA: Insufficient documentation

## 2022-07-22 DIAGNOSIS — L299 Pruritus, unspecified: Secondary | ICD-10-CM | POA: Insufficient documentation

## 2022-07-22 DIAGNOSIS — R21 Rash and other nonspecific skin eruption: Secondary | ICD-10-CM | POA: Insufficient documentation

## 2022-07-22 DIAGNOSIS — Z5321 Procedure and treatment not carried out due to patient leaving prior to being seen by health care provider: Secondary | ICD-10-CM | POA: Diagnosis not present

## 2022-07-22 LAB — BASIC METABOLIC PANEL
Anion gap: 6 (ref 5–15)
BUN: 6 mg/dL (ref 6–20)
CO2: 22 mmol/L (ref 22–32)
Calcium: 8.8 mg/dL — ABNORMAL LOW (ref 8.9–10.3)
Chloride: 111 mmol/L (ref 98–111)
Creatinine, Ser: 0.53 mg/dL (ref 0.44–1.00)
GFR, Estimated: >60 mL/min (ref >60)
Glucose, Bld: 93 mg/dL (ref 70–99)
Potassium: 4.3 mmol/L (ref 3.5–5.1)
Sodium: 139 mmol/L (ref 135–145)

## 2022-07-22 LAB — CBC WITH DIFFERENTIAL/PLATELET
Abs Immature Granulocytes: 0 10*3/uL (ref 0.00–0.07)
Basophils Absolute: 0 10*3/uL (ref 0.0–0.1)
Basophils Relative: 0 %
Eosinophils Absolute: 0.2 10*3/uL (ref 0.0–0.5)
Eosinophils Relative: 5 %
HCT: 40.4 % (ref 36.0–46.0)
Hemoglobin: 13.3 g/dL (ref 12.0–15.0)
Immature Granulocytes: 0 %
Lymphocytes Relative: 40 %
Lymphs Abs: 1.6 10*3/uL (ref 0.7–4.0)
MCH: 31.1 pg (ref 26.0–34.0)
MCHC: 32.9 g/dL (ref 30.0–36.0)
MCV: 94.6 fL (ref 80.0–100.0)
Monocytes Absolute: 0.3 10*3/uL (ref 0.1–1.0)
Monocytes Relative: 7 %
Neutro Abs: 1.9 10*3/uL (ref 1.7–7.7)
Neutrophils Relative %: 48 %
Platelets: 272 10*3/uL (ref 150–400)
RBC: 4.27 MIL/uL (ref 3.87–5.11)
RDW: 11.9 % (ref 11.5–15.5)
WBC: 4 10*3/uL (ref 4.0–10.5)
nRBC: 0 % (ref 0.0–0.2)

## 2022-07-22 LAB — I-STAT BETA HCG BLOOD, ED (MC, WL, AP ONLY): I-stat hCG, quantitative: <5 m[IU]/mL (ref <5)

## 2022-07-22 LAB — SEDIMENTATION RATE: Sed Rate: 4 mm/hr (ref 0–22)

## 2022-07-22 LAB — LACTIC ACID, PLASMA: Lactic Acid, Venous: 1.2 mmol/L (ref 0.5–1.9)

## 2022-07-22 LAB — C-REACTIVE PROTEIN: CRP: 0.6 mg/dL (ref <1.0)

## 2022-07-22 MED ORDER — CETIRIZINE HCL 10 MG PO TABS: 10.0000 mg | ORAL_TABLET | Freq: Every day | ORAL | 0 refills | Status: DC

## 2022-07-22 MED ORDER — LORATADINE 10 MG PO TABS
10.0000 mg | ORAL_TABLET | Freq: Every day | ORAL | Status: DC
  Administered 2022-07-22: 10 mg via ORAL
  Filled 2022-07-22: qty 1

## 2022-07-22 NOTE — ED Provider Notes (Signed)
  [facility] EMERGENCY DEPARTMENT Provider Note   CSN: [id number] Arrival date & time: 07/22/22  4818     History {Add pertinent medical, surgical, social history, OB history to HPI:1} No chief complaint on file.   Sierra Thomas is a 31 y.o. female.  HPI     Home Medications Prior to Admission medications   Medication Sig Start Date End Date Taking? Authorizing Provider  Prenatal Vit-Fe Fumarate-FA (PRENATAL VITAMINS) 28-0.8 MG TABS Take 1 tablet by mouth daily. 06/14/22   Leftwich-Kirby, Wilmer Floor, CNM      Allergies    Patient has no known allergies.    Review of Systems   Review of Systems  Physical Exam Updated Vital Signs BP 106/72   Pulse (!) 59   Temp 98.3 F (36.8 C) (Oral)   Resp 16   SpO2 100%  Physical Exam  ED Results / Procedures / Treatments   Labs (all labs ordered are listed, but only abnormal results are displayed) Labs Reviewed - No data to display  EKG None  Radiology DG Hand Complete Left  Result Date: 07/22/2022 CLINICAL DATA:  hand swelling. EXAM: LEFT HAND - COMPLETE 3+ VIEW COMPARISON:  None available FINDINGS: There is no evidence of fracture or dislocation. There is no evidence of arthropathy or other focal bone abnormality. There is diffuse dorsal soft tissue swelling. IMPRESSION: 1. Dorsal soft tissue swelling. 2. No acute bone abnormality. Electronically Signed   By: Signa Kell M.D.   On: 07/22/2022 05:09    Procedures Procedures  {Document cardiac monitor, telemetry assessment procedure when appropriate:1}  Medications Ordered in ED Medications - No data to display  ED Course/ Medical Decision Making/ A&P                           Medical Decision Making  This is a 31 y.o. female who presents to the emergency department with ***. History is obtained from ***. Medical history reviewed in EMR.  Past medical/surgical history that increases complexity of ED encounter:  ***  Initial Assessment:   With the patient's presentation of ***, most likely diagnosis is ***. Other diagnoses were considered including (but not limited to) ***. These are considered less likely due to history of present illness and physical exam findings.    Initial Plan: ***  Interpretation of Diagnostics: I personally reviewed the *** and my interpretation is as follows:  ***  ***   MDM generated using voice dictation software and may contain dictation errors. Please contact me for any clarification or with any questions.    {Document critical care time when appropriate:1} {Document review of labs and clinical decision tools ie heart score, Chads2Vasc2 etc:1}  {Document your independent review of radiology images, and any outside records:1} {Document your discussion with family members, caretakers, and with consultants:1} {Document social determinants of health affecting pt's care:1} {Document your decision making why or why not admission, treatments were needed:1} Final Clinical Impression(s) / ED Diagnoses Final diagnoses:  None    Rx / DC Orders ED Discharge Orders     None

## 2022-07-22 NOTE — ED Provider Triage Note (Signed)
Emergency Medicine Provider Triage Evaluation Note  Sierra Thomas , a 31 y.o. female  was evaluated in triage.  Pt complains of left hand pain and swelling since she woke up yesterday, progressively worse.   No fevers or chills.  No known injury to the hand or bites.  Review of Systems  Positive: As above Negative:  As above  Physical Exam  BP 117/80 (BP Location: Right Arm)   Pulse 73   Temp 98.3 F (36.8 C) (Oral)   Resp 15   Ht 5\' 2"  (1.575 m)   Wt 69.9 kg   SpO2 100%   BMI 28.17 kg/m  Gen:   Awake, no distress   Resp:  Normal effort  MSK:   Moves extremities without difficulty  Other:  Left hand with significant swelling over the dorsum of the hand with normal cap refill, sensation and range of motion of all digits.  Tenderness palpation extending to the mid forearm without erythema.  Medical Decision Making  Medically screening exam initiated at 3:38 AM.  Appropriate orders placed.  Javana Schey was informed that the remainder of the evaluation will be completed by another provider, this initial triage assessment does not replace that evaluation, and the importance of remaining in the ED until their evaluation is complete.  This chart was dictated using voice recognition software, Dragon. Despite the best efforts of this provider to proofread and correct errors, errors may still occur which can change documentation meaning.    Bethel Born, PA-C 07/22/22 0345

## 2022-07-22 NOTE — ED Notes (Signed)
Patient stated she had to leave to attend to her children.

## 2022-07-22 NOTE — ED Triage Notes (Signed)
Patient was here earlier and left prior to being seen. Complains of left hand swelling x 1 day. Denies trauma

## 2022-07-22 NOTE — ED Provider Triage Note (Signed)
Emergency Medicine Provider Triage Evaluation Note  Sierra Thomas , a 31 y.o. female  was evaluated in triage.  Pt complains of left hand pain/swelling since yesterday.  No recent injury, trauma, fall.  No fever at home.  Denies history of gout.  Tried ibuprofen at home.  Review of Systems  Positive:  Negative:   Physical Exam  There were no vitals taken for this visit. Gen:   Awake, no distress   Resp:  Normal effort  MSK:   Moves extremities without difficulty  Other:  Left hand with swelling noted to the dorsum of the hand.  Normal cap refill.  Sensation intact.  Normal range of motion of all digits.  Radial pulse intact. Medical Decision Making  Medically screening exam initiated at 8:46 AM.  Appropriate orders placed.  Kimaria Struthers was informed that the remainder of the evaluation will be completed by another provider, this initial triage assessment does not replace that evaluation, and the importance of remaining in the ED until their evaluation is complete.     Keyla Milone A, PA-C 07/22/22 [phone]

## 2022-07-22 NOTE — ED Triage Notes (Signed)
Patient presents to ed c/o left hand swelling states she noticed a rash then her hand started swelling states she is 1 month postpartum denies injury

## 2022-07-22 NOTE — ED Notes (Signed)
Left hand is swollen.  Pt reports that this has been going on for 2 days.

## 2022-07-27 LAB — CULTURE, BLOOD (ROUTINE X 2)
Culture: NO GROWTH
Culture: NO GROWTH
Special Requests: ADEQUATE
Special Requests: ADEQUATE

## 2022-08-15 ENCOUNTER — Encounter: Payer: Self-pay | Admitting: Advanced Practice Midwife

## 2022-08-15 ENCOUNTER — Ambulatory Visit (INDEPENDENT_AMBULATORY_CARE_PROVIDER_SITE_OTHER): Payer: Medicaid Other | Admitting: Advanced Practice Midwife

## 2022-08-15 DIAGNOSIS — Z3009 Encounter for other general counseling and advice on contraception: Secondary | ICD-10-CM

## 2022-08-15 NOTE — Progress Notes (Signed)
Post Partum Visit Note  Sierra Thomas is a 32 y.o. G15P3003 female who presents for a postpartum visit. She is 7 weeks postpartum following a normal spontaneous vaginal delivery.  I have fully reviewed the prenatal and intrapartum course. The delivery was at [redacted]w[redacted]d gestational weeks.  Anesthesia: none. Postpartum course has been unremarkable. Baby is doing well. Baby is feeding by breast. Bleeding no bleeding. Bowel function is normal. Bladder function is normal. Patient is not sexually active. Contraception method is none. Postpartum depression screening: negative.   The pregnancy intention screening data noted above was reviewed. Potential methods of contraception were discussed. The patient elected to proceed with No data recorded.   Edinburgh Postnatal Depression Scale - 08/15/22 1320       Edinburgh Postnatal Depression Scale:  In the Past 7 Days   I have been able to laugh and see the funny side of things. 0    I have looked forward with enjoyment to things. 0    I have blamed myself unnecessarily when things went wrong. 0    I have been anxious or worried for no good reason. 0    I have felt scared or panicky for no good reason. 0    Things have been getting on top of me. 0    I have been so unhappy that I have had difficulty sleeping. 0    I have felt sad or miserable. 0    I have been so unhappy that I have been crying. 0    The thought of harming myself has occurred to me. 0    Edinburgh Postnatal Depression Scale Total 0             Health Maintenance Due  Topic Date Due   INFLUENZA VACCINE  Never done    The following portions of the patient's history were reviewed and updated as appropriate: allergies, current medications, past family history, past medical history, past social history, past surgical history, and problem list.  Review of Systems Pertinent items noted in HPI and remainder of comprehensive ROS otherwise negative.  Objective:  BP 119/75    Pulse 71   Wt 153 lb 1.6 oz (69.4 kg)   Breastfeeding Yes   BMI 28.00 kg/m    VS reviewed, nursing note reviewed,  Constitutional: well developed, well nourished, no distress HEENT: normocephalic CV: normal rate Pulm/chest wall: normal effort Abdomen: soft Neuro: alert and oriented x 3 Skin: warm, dry Psych: affect normal   Assessment:   1. Encounter for postpartum visit --Doing well, bonding well with baby, breastfeeding, good support at home  2. Encounter for counseling regarding contraception --Discussed pt contraceptive plans and reviewed contraceptive methods based on pt preferences and effectiveness.  Pt prefers to continue lactational amenorrhea for now.  Appt in 2 months to discuss again.   --Discussed factors affecting LAM, including frequency of feeding, supplementation, pumping, etc.   Plan:   Essential components of care per ACOG recommendations:  1.  Mood and well being: Patient with negative depression screening today. Reviewed local resources for support.  - Patient tobacco use? No.   - hx of drug use? No.    2. Infant care and feeding:  -Patient currently breastmilk feeding? Yes. Reviewed importance of draining breast regularly to support lactation.  -Social determinants of health (SDOH) reviewed in EPIC. No concerns   3. Sexuality, contraception and birth spacing - Patient does not want a pregnancy in the next year.   - Reviewed reproductive  life planning. Reviewed contraceptive methods based on pt preferences and effectiveness.  Patient desired FAM or LAM today.   - Discussed birth spacing of 18 months  4. Sleep and fatigue -Encouraged family/partner/community support of 4 hrs of uninterrupted sleep to help with mood and fatigue  5. Physical Recovery  - Discussed patients delivery and complications. She describes her labor as good. - Patient had a Vaginal, no problems at delivery. Patient had no laceration. Perineal healing reviewed. Patient expressed  understanding - Patient has urinary incontinence? No. - Patient is safe to resume physical and sexual activity  6.  Health Maintenance - HM due items addressed Yes - Last pap smear  Diagnosis  Date Value Ref Range Status  05/19/2022   Final   - Negative for intraepithelial lesion or malignancy (NILM)   Pap smear not done at today's visit.  -Breast Cancer screening indicated? No.   7. Chronic Disease/Pregnancy Condition follow up:  Abdominal pain, hernia dx by Urgent Care during pregnancy.  No abdominal hernia noted today, no diastasis recti, with separation ~ 2 cm only.  Pt to f/u with PCP or our office if pain at umbilical area persists.  - PCP follow up  Fatima Blank, [place] [facility] [employer], [place]

## 2022-08-15 NOTE — Progress Notes (Signed)
error 

## 2022-10-09 ENCOUNTER — Ambulatory Visit: Payer: Medicaid Other | Admitting: Obstetrics and Gynecology

## 2022-10-17 ENCOUNTER — Ambulatory Visit: Payer: Medicaid Other | Admitting: Obstetrics and Gynecology

## 2023-06-16 ENCOUNTER — Ambulatory Visit (HOSPITAL_COMMUNITY)
Admission: EM | Admit: 2023-06-16 | Discharge: 2023-06-16 | Disposition: A | Discharge status: Home / Self Care | Payer: Medicaid Other | Attending: Family Medicine | Admitting: Family Medicine

## 2023-06-16 ENCOUNTER — Encounter (HOSPITAL_COMMUNITY): Payer: Self-pay

## 2023-06-16 ENCOUNTER — Inpatient Hospital Stay (HOSPITAL_COMMUNITY)
Admission: AD | Admit: 2023-06-16 | Discharge: 2023-06-16 | Disposition: A | Discharge status: Home / Self Care | Payer: Medicaid Other | Attending: Obstetrics & Gynecology | Admitting: Obstetrics & Gynecology

## 2023-06-16 ENCOUNTER — Encounter (HOSPITAL_COMMUNITY): Payer: Self-pay | Admitting: Obstetrics & Gynecology

## 2023-06-16 ENCOUNTER — Inpatient Hospital Stay (HOSPITAL_COMMUNITY): Payer: Medicaid Other

## 2023-06-16 DIAGNOSIS — R1031 Right lower quadrant pain: Secondary | ICD-10-CM

## 2023-06-16 DIAGNOSIS — Z3201 Encounter for pregnancy test, result positive: Secondary | ICD-10-CM | POA: Diagnosis not present

## 2023-06-16 DIAGNOSIS — O26891 Other specified pregnancy related conditions, first trimester: Secondary | ICD-10-CM | POA: Diagnosis present

## 2023-06-16 DIAGNOSIS — O26899 Other specified pregnancy related conditions, unspecified trimester: Secondary | ICD-10-CM

## 2023-06-16 DIAGNOSIS — R102 Pelvic and perineal pain: Secondary | ICD-10-CM | POA: Diagnosis not present

## 2023-06-16 DIAGNOSIS — Z3A01 Less than 8 weeks gestation of pregnancy: Secondary | ICD-10-CM | POA: Insufficient documentation

## 2023-06-16 DIAGNOSIS — R109 Unspecified abdominal pain: Secondary | ICD-10-CM | POA: Diagnosis not present

## 2023-06-16 DIAGNOSIS — R519 Headache, unspecified: Secondary | ICD-10-CM | POA: Diagnosis not present

## 2023-06-16 DIAGNOSIS — Z3491 Encounter for supervision of normal pregnancy, unspecified, first trimester: Secondary | ICD-10-CM

## 2023-06-16 LAB — WET PREP, GENITAL
Clue Cells Wet Prep HPF POC: NONE SEEN
Sperm: NONE SEEN
Trich, Wet Prep: NONE SEEN
WBC, Wet Prep HPF POC: <10 (ref <10)
Yeast Wet Prep HPF POC: NONE SEEN

## 2023-06-16 LAB — POCT URINALYSIS DIP (MANUAL ENTRY)
Bilirubin, UA: NEGATIVE
Blood, UA: NEGATIVE
Glucose, UA: NEGATIVE mg/dL
Ketones, POC UA: NEGATIVE mg/dL
Leukocytes, UA: NEGATIVE
Nitrite, UA: NEGATIVE
Protein Ur, POC: NEGATIVE mg/dL
Spec Grav, UA: 1.02 (ref 1.010–1.025)
Urobilinogen, UA: 0.2 U/dL
pH, UA: 8.5 — AB (ref 5.0–8.0)

## 2023-06-16 LAB — CBC
HCT: 36.6 % (ref 36.0–46.0)
Hemoglobin: 13 g/dL (ref 12.0–15.0)
MCH: 31.5 pg (ref 26.0–34.0)
MCHC: 35.5 g/dL (ref 30.0–36.0)
MCV: 88.6 fL (ref 80.0–100.0)
Platelets: 281 10*3/uL (ref 150–400)
RBC: 4.13 MIL/uL (ref 3.87–5.11)
RDW: 11.9 % (ref 11.5–15.5)
WBC: 4.3 10*3/uL (ref 4.0–10.5)
nRBC: 0 % (ref 0.0–0.2)

## 2023-06-16 LAB — HCG, QUANTITATIVE, PREGNANCY: hCG, Beta Chain, Quant, S: 2298 m[IU]/mL — ABNORMAL HIGH (ref <5)

## 2023-06-16 LAB — POCT URINE PREGNANCY: Preg Test, Ur: POSITIVE — AB

## 2023-06-16 MED ORDER — ACETAMINOPHEN-CAFFEINE 500-65 MG PO TABS
2.0000 | ORAL_TABLET | Freq: Once | ORAL | Status: AC
  Administered 2023-06-16: 2 via ORAL
  Filled 2023-06-16: qty 2

## 2023-06-16 MED ORDER — METOCLOPRAMIDE HCL 10 MG PO TABS
10.0000 mg | ORAL_TABLET | Freq: Once | ORAL | Status: AC
  Administered 2023-06-16: 10 mg via ORAL
  Filled 2023-06-16: qty 1

## 2023-06-16 NOTE — MAU Provider Note (Signed)
History     CSN: [id number]  Arrival date and time: 06/16/23 1331   None     Chief Complaint  Patient presents with   Abdominal Pain   Headache   HPI  Sierra Thomas is a 32 y.o. G4P3003 at [redacted]w[redacted]d who presents for evaluation of lower abdominal pain and headache. Patient reports the pain in her abdomen started last night. Patient rates the pain as a 7/10 and has not tried anything for the pain. She rates the pain in her head a 5/10 and has not tried anything for the pain. She went to urgent care where she was told she was pregnant. She denies any vaginal bleeding and discharge. Denies any constipation, diarrhea or any urinary complaints.    OB History     Gravida  4   Para  3   Term  3   Preterm      AB      Living  3      SAB      IAB      Ectopic      Multiple  0   Live Births  3           Past Medical History:  Diagnosis Date   Anemia    Anemia 01/10/2019   CBC Latest Ref Rng & Units 06/21/2020 01/11/2019 01/10/2019 WBC 3.4 - 10.8 x10E3/uL 4.4 9.6 6.9 Hemoglobin 11.1 - 15.9 g/dL 82.9 10.7(L) 11.8(L) Hematocrit 34.0 - 46.6 % 34.5 32.7(L) 35.6(L) Platelets 150 - 450 x10E3/uL 150 228 220     Past Surgical History:  Procedure Laterality Date   NO PAST SURGERIES      Family History  Problem Relation Age of Onset   Healthy Mother    Healthy Father     Social History   Tobacco Use   Smoking status: Never   Smokeless tobacco: Never  Vaping Use   Vaping status: Never Used  Substance Use Topics   Alcohol use: Never   Drug use: Never    Allergies: No Known Allergies  No medications prior to admission.    Review of Systems  Constitutional: Negative.  Negative for fatigue and fever.  HENT: Negative.    Respiratory: Negative.  Negative for shortness of breath.   Cardiovascular: Negative.  Negative for chest pain.  Gastrointestinal:  Positive for abdominal pain. Negative for constipation, diarrhea, nausea and vomiting.  Genitourinary:  Negative.  Negative for dysuria, vaginal bleeding and vaginal discharge.  Neurological:  Positive for headaches. Negative for dizziness.   Physical Exam   Blood pressure 119/68, pulse 78, temperature 98.4 F (36.9 C), temperature source Oral, resp. rate 16, height 5' (1.524 m), weight 65.4 kg, last menstrual period 05/15/2023, SpO2 100%, currently breastfeeding.  Patient Vitals for the past 24 hrs:  BP Temp Temp src Pulse Resp SpO2 Height Weight  06/16/23 1401 119/68 98.4 F (36.9 C) Oral 78 16 100 % 5' (1.524 m) 65.4 kg    Physical Exam Vitals and nursing note reviewed.  Constitutional:      General: She is not in acute distress.    Appearance: She is well-developed.  HENT:     Head: Normocephalic.  Eyes:     Pupils: Pupils are equal, round, and reactive to light.  Cardiovascular:     Rate and Rhythm: Normal rate and regular rhythm.     Heart sounds: Normal heart sounds.  Pulmonary:     Effort: Pulmonary effort is normal. No respiratory distress.  Breath sounds: Normal breath sounds.  Abdominal:     General: Bowel sounds are normal. There is no distension.     Palpations: Abdomen is soft.     Tenderness: There is no abdominal tenderness.  Skin:    General: Skin is warm and dry.  Neurological:     Mental Status: She is alert and oriented to person, place, and time.  Psychiatric:        Mood and Affect: Mood normal.        Behavior: Behavior normal.        Thought Content: Thought content normal.        Judgment: Judgment normal.      MAU Course  Procedures  Results for orders placed or performed during the hospital encounter of 06/16/23 (from the past 24 hour(s))  CBC     Status: None   Collection Time: 06/16/23  2:13 PM  Result Value Ref Range   WBC 4.3 4.0 - 10.5 K/uL   RBC 4.13 3.87 - 5.11 MIL/uL   Hemoglobin 13.0 12.0 - 15.0 g/dL   HCT 16.1 09.6 - 04.5 %   MCV 88.6 80.0 - 100.0 fL   MCH 31.5 26.0 - 34.0 pg   MCHC 35.5 30.0 - 36.0 g/dL   RDW 40.9 81.1  - 91.4 %   Platelets 281 150 - 400 K/uL   nRBC 0.0 0.0 - 0.2 %  hCG, quantitative, pregnancy     Status: Abnormal   Collection Time: 06/16/23  2:13 PM  Result Value Ref Range   hCG, Beta Chain, Quant, S 2,298 (H) <5 mIU/mL  Wet prep, genital     Status: None   Collection Time: 06/16/23  2:30 PM   Specimen: Vaginal  Result Value Ref Range   Yeast Wet Prep HPF POC NONE SEEN NONE SEEN   Trich, Wet Prep NONE SEEN NONE SEEN   Clue Cells Wet Prep HPF POC NONE SEEN NONE SEEN   WBC, Wet Prep HPF POC <10 <10   Sperm NONE SEEN      US OB LESS THAN 14 WEEKS WITH OB TRANSVAGINAL  Result Date: 06/16/2023 CLINICAL DATA:  Right pelvic pain in 1st trimester pregnancy. EXAM: OBSTETRIC <14 WK [place] AND TRANSVAGINAL OB US TECHNIQUE: Both transabdominal and transvaginal ultrasound examinations were performed for complete evaluation of the gestation as well as the maternal uterus, adnexal regions, and pelvic cul-de-sac. Transvaginal technique was performed to assess early pregnancy. COMPARISON:  None Available. FINDINGS: Intrauterine gestational sac: Single Yolk sac:  Not Visualized. Embryo:  Not Visualized. MSD: 3 mm   5 w   0 d Subchorionic hemorrhage:  None visualized. Maternal uterus/adnexae: Small right ovarian corpus luteum noted. Normal appearance of left ovary. No mass or abnormal free fluid identified. IMPRESSION: Single intrauterine gestational sac measuring 5 weeks 0 days. Consider correlation with serial b-hCG levels, and followup ultrasound to assess viability in 10-14 days. Electronically Signed   By: Danae Orleans M.D.   On: 06/16/2023 16:00     MDM  Labs ordered and reviewed.   UA, UPT CBC, HCG ABO/Rh- O Pos Wet prep and gc/chlamydia US OB Comp Less 14 weeks with Transvaginal  Excedrin Tension/Reglan- patient reports resolution of pain.  CNM independently reviewed the imaging ordered. Imaging show gestational sac but no yolk sac or fetal pole yet  CNM consulted with Dr. Debroah Loop regarding  presentation and results- MD recommends repeat u/s in 2 weeks, ordered  Assessment and Plan   1. Normal intrauterine pregnancy  on prenatal ultrasound in first trimester   2. [redacted] weeks gestation of pregnancy   3. Abdominal pain affecting pregnancy     -Discharge home in stable condition -First trimester precautions discussed -Patient advised to follow-up with ultrasound in 2 weeks, order placed -Patient may return to MAU as needed or if her condition were to change or worsen  Rolm Bookbinder, CNM 06/16/2023, 4:10 PM

## 2023-06-16 NOTE — ED Triage Notes (Signed)
Patient reports that she began having lower abdominal pain 2 days ago, headache on the right and right hip pain since yesterday. Patient denies any vaginal discharge, dysuria, urinary frequency, or blurred vision.  Patient denies taking any medications for her symptoms.

## 2023-06-16 NOTE — MAU Note (Signed)
Leo Bellucci is a 32 y.o. at [redacted]w[redacted]d here in MAU reporting: having a lot of pain in RLQ.  Comes and goes.pounding pain. Also has a HA, started last wk. Has not taken anything for the pain. Did not know she was preg until they told her at [facility]. Denies vag bleeding or d/c. LMP: 10/15 Onset of complaint: started last wk Pain score: abd 7, HA 6 Vitals:   06/16/23 1401  BP: 119/68  Pulse: 78  Resp: 16  Temp: 98.4 F (36.9 C)  SpO2: 100%      Lab orders placed from triage:

## 2023-06-16 NOTE — ED Notes (Signed)
Patient is being discharged from the Urgent Care and sent to the Emergency Department via POV . Per Mardella Layman, MD, patient is in need of higher level of care due to pregnancy, low abdominal pain. Patient is aware and verbalizes understanding of plan of care.  Vitals:   06/16/23 1216  BP: 110/63  Pulse: 82  Resp: 14  Temp: 98.3 F (36.8 C)  SpO2: 97%

## 2023-06-16 NOTE — Discharge Instructions (Signed)

## 2023-06-16 NOTE — Discharge Instructions (Signed)
Labs Reviewed  POCT URINALYSIS DIP (MANUAL ENTRY) - Abnormal; Notable for the following components:      Result Value   pH, UA 8.5 (*)    All other components within normal limits  POCT URINE PREGNANCY - Abnormal; Notable for the following components:   Preg Test, Ur Positive (*)    All other components within normal limits

## 2023-06-16 NOTE — ED Provider Notes (Signed)
[facility]   [id number] 06/16/23 Arrival Time: 1050  ASSESSMENT & PLAN:  1. Right lower quadrant abdominal pain   2. Positive pregnancy test    Overall benign abd exam but does complain of mild R lower pain with palpation. With + preg test here. Cannot r/o ectopic. Sent to MAU for further evaluation; by POV; stable upon discharge.   Labs Reviewed  POCT URINALYSIS DIP (MANUAL ENTRY) - Abnormal; Notable for the following components:      Result Value   pH, UA 8.5 (*)    All other components within normal limits  POCT URINE PREGNANCY - Abnormal; Notable for the following components:   Preg Test, Ur Positive (*)    All other components within normal limits     Follow-up Information     Go to  [facility] ([facility]U).   Contact information: [address] [place],  [place]  [address]                Reviewed expectations re: course of current medical issues. Questions answered. Outlined signs and symptoms indicating need for more acute intervention. Patient verbalized understanding. After Visit Summary given.   SUBJECTIVE: History from: patient. Sierra Thomas is a 32 y.o. female who presents with complaint of R lower abd pain; gradual onset; first noted approx 2 d ago; dull in nature; non-radiating although she does report R hip has been sore for sev days. Feels pain mostly during day; occas at night. Denies vaginal discharge, dysuria, hematuria, n/v/d. Normal appetite.  OB History     Gravida  4   Para  3   Term  3   Preterm      AB      Living  3      SAB      IAB      Ectopic      Multiple  0   Live Births  3          Patient's last menstrual period was 05/15/2023.  Past Surgical History:  Procedure Laterality Date   NO PAST SURGERIES       OBJECTIVE:  Vitals:   06/16/23 1216  BP: 110/63  Pulse: 82  Resp: 14  Temp: 98.3 F (36.8 C)  TempSrc: Oral  SpO2: 97%     General appearance: alert, oriented, no acute distress HEENT: [place]; AT; oropharynx moist Lungs: unlabored respirations Abdomen: soft; without distention; mild TTP over R lower abdomen; normal bowel sounds; without masses or organomegaly; without guarding or rebound tenderness Back: without reported CVA tenderness; FROM at waist Extremities: without LE edema; symmetrical; without gross deformities Skin: warm and dry Neurologic: normal gait Psychological: alert and cooperative; normal mood and affect  Labs: Results for orders placed or performed during the hospital encounter of 06/16/23  POC urinalysis dipstick  Result Value Ref Range   Color, UA yellow yellow   Clarity, UA clear clear   Glucose, UA negative negative mg/dL   Bilirubin, UA negative negative   Ketones, POC UA negative negative mg/dL   Spec Grav, UA 6.213 0.865 - 1.025   Blood, UA negative negative   pH, UA 8.5 (A) 5.0 - 8.0   Protein Ur, POC negative negative mg/dL   Urobilinogen, UA 0.2 0.2 or 1.0 E.U./dL   Nitrite, UA Negative Negative   Leukocytes, UA Negative Negative  POCT urine pregnancy  Result Value Ref Range   Preg Test, Ur Positive (A) Negative   Labs  Reviewed  POCT URINALYSIS DIP (MANUAL ENTRY) - Abnormal; Notable for the following components:      Result Value   pH, UA 8.5 (*)    All other components within normal limits  POCT URINE PREGNANCY - Abnormal; Notable for the following components:   Preg Test, Ur Positive (*)    All other components within normal limits    Imaging: No results found.   No Known Allergies                                             Past Medical History:  Diagnosis Date   Anemia    Anemia 01/10/2019   CBC Latest Ref Rng & Units 06/21/2020 01/11/2019 01/10/2019 WBC 3.4 - 10.8 x10E3/uL 4.4 9.6 6.9 Hemoglobin 11.1 - 15.9 g/dL 78.2 10.7(L) 11.8(L) Hematocrit 34.0 - 46.6 % 34.5 32.7(L) 35.6(L) Platelets 150 - 450 x10E3/uL 150 228 220     Social History   Socioeconomic  History   Marital status: Married    Spouse name: Innocent   Number of children: 2   Years of education: Not on file   Highest education level: Not on file  Occupational History   Not on file  Tobacco Use   Smoking status: Never   Smokeless tobacco: Never  Vaping Use   Vaping status: Never Used  Substance and Sexual Activity   Alcohol use: Never   Drug use: Never   Sexual activity: Yes    Partners: Male  Other Topics Concern   Not on file  Social History Narrative   Works at [employer]. Education: high Garment/textile technologist.    Social Determinants of Health   Financial Resource Strain: Not on file  Food Insecurity: Food Insecurity Present (06/23/2022)   Hunger Vital Sign    Worried About Running Out of Food in the Last Year: Sometimes true    Ran Out of Food in the Last Year: Sometimes true  Transportation Needs: No Transportation Needs (06/23/2022)   PRAPARE - Administrator, Civil Service (Medical): No    Lack of Transportation (Non-Medical): No  Physical Activity: Not on file  Stress: Not on file  Social Connections: Not on file  Intimate Partner Violence: Not At Risk (06/23/2022)   Humiliation, Afraid, Rape, and Kick questionnaire    Fear of Current or Ex-Partner: No    Emotionally Abused: No    Physically Abused: No    Sexually Abused: No    Family History  Problem Relation Age of Onset   Healthy Mother    Healthy Father      Mardella Layman, MD 06/16/23 1353

## 2023-06-18 LAB — GC/CHLAMYDIA PROBE AMP (~~LOC~~) NOT AT ARMC
Chlamydia: NEGATIVE
Comment: NEGATIVE
Comment: NORMAL
Neisseria Gonorrhea: NEGATIVE

## 2023-06-29 ENCOUNTER — Ambulatory Visit (HOSPITAL_COMMUNITY): Admission: RE | Admit: 2023-06-29 | Payer: Medicaid Other | Source: Ambulatory Visit

## 2023-07-12 ENCOUNTER — Ambulatory Visit: Payer: Medicaid Other | Admitting: Obstetrics and Gynecology

## 2023-08-01 NOTE — L&D Delivery Note (Signed)
 OB/[facility]N Faculty Practice Delivery Note  Sierra Thomas is a 33 y.o. [phone] s/p SVD at [redacted]w[redacted]d. She was admitted for IOL for postdates.   ROM: 9h 72m with clear fluid GBS Status:  Negative/-- (06/23 1134) Maximum Maternal Temperature: 36.9F  Labor Progress: Initial SVE: 1/thick/-3. She was augmented with Cytotec  and Cooks catheter. She was AROM-ed. She had some late decels and was counseled on potential C-section. Patient declined and was monitored closely. She made appropriate change with accels and moderate variability. She then progressed to complete.   Delivery Date/Time: 02/18/24 1745  Delivery: Called to room and patient was complete and pushing. Head delivered OP. No nuchal cord present. Shoulder and body delivered in usual fashion. Infant with spontaneous cry, placed on mother's abdomen, dried and stimulated. Cord clamped x 2 after 1-minute delay, and cut by FOB. Cord blood drawn. Placenta delivered spontaneously with gentle cord traction. Fundus firm with massage and Pitocin . Labia, perineum, vagina, and cervix inspected with no laceration.  Baby Weight: pending  Placenta: 3 vessel, intact. Sent to L&D Complications: None Lacerations: none EBL: 125 mL Analgesia: None   Infant:  APGAR (1 MIN): 8  APGAR (5 MINS): 9   Hobert JAYSON Kasal, MD Family Medicine PGY-3 02/18/2024, 6:02 PM

## 2023-11-26 ENCOUNTER — Inpatient Hospital Stay (HOSPITAL_COMMUNITY)
Admission: AD | Admit: 2023-11-26 | Discharge: 2023-11-26 | Disposition: A | Discharge status: Home / Self Care | Attending: Obstetrics and Gynecology | Admitting: Obstetrics and Gynecology

## 2023-11-26 ENCOUNTER — Encounter (HOSPITAL_COMMUNITY): Payer: Self-pay | Admitting: Emergency Medicine

## 2023-11-26 ENCOUNTER — Encounter (HOSPITAL_COMMUNITY): Payer: Self-pay | Admitting: Obstetrics and Gynecology

## 2023-11-26 ENCOUNTER — Ambulatory Visit (HOSPITAL_COMMUNITY)
Admission: EM | Admit: 2023-11-26 | Discharge: 2023-11-26 | Disposition: A | Discharge status: Home / Self Care | Attending: Emergency Medicine | Admitting: Emergency Medicine

## 2023-11-26 ENCOUNTER — Other Ambulatory Visit: Payer: Self-pay

## 2023-11-26 DIAGNOSIS — O26892 Other specified pregnancy related conditions, second trimester: Secondary | ICD-10-CM | POA: Diagnosis not present

## 2023-11-26 DIAGNOSIS — O26893 Other specified pregnancy related conditions, third trimester: Secondary | ICD-10-CM | POA: Insufficient documentation

## 2023-11-26 DIAGNOSIS — R42 Dizziness and giddiness: Secondary | ICD-10-CM | POA: Diagnosis not present

## 2023-11-26 DIAGNOSIS — R760 Raised antibody titer: Secondary | ICD-10-CM | POA: Diagnosis not present

## 2023-11-26 DIAGNOSIS — Z3A27 27 weeks gestation of pregnancy: Secondary | ICD-10-CM | POA: Diagnosis not present

## 2023-11-26 DIAGNOSIS — R519 Headache, unspecified: Secondary | ICD-10-CM

## 2023-11-26 DIAGNOSIS — Z3A28 28 weeks gestation of pregnancy: Secondary | ICD-10-CM | POA: Diagnosis not present

## 2023-11-26 DIAGNOSIS — R1031 Right lower quadrant pain: Secondary | ICD-10-CM

## 2023-11-26 LAB — POCT FASTING CBG KUC MANUAL ENTRY: POCT Glucose (KUC): 82 mg/dL (ref 70–99)

## 2023-11-26 LAB — POCT URINE PREGNANCY: Preg Test, Ur: POSITIVE — AB

## 2023-11-26 MED ORDER — EXCEDRIN TENSION HEADACHE 500-65 MG PO TABS: 2.0000 | ORAL_TABLET | Freq: Four times a day (QID) | ORAL | 0 refills | Status: DC | PRN

## 2023-11-26 MED ORDER — ACETAMINOPHEN-CAFFEINE 500-65 MG PO TABS
2.0000 | ORAL_TABLET | Freq: Once | ORAL | Status: AC
  Administered 2023-11-26: 2 via ORAL
  Filled 2023-11-26: qty 2

## 2023-11-26 NOTE — ED Provider Notes (Signed)
 [facility]    CSN: [id number] Arrival date & time: 11/26/23  1808      History   Chief Complaint Chief Complaint  Patient presents with   Headache    HPI Sierra Thomas is a 33 y.o. female.   Patient presents to clinic over concerns of headache, dizziness and right lower quadrant abdominal pain that started today upon wakening.  She has not had any recent sick contacts.  Reports she is [redacted] weeks pregnant, has not had an OB/GYN appointment at this time but does have one scheduled for sometime next month.  Has not had any vaginal bleeding.  Reports right lower quadrant abdominal pain is 8 out of 10.  Has not tried any medications or interventions for her symptoms.  Has not had any syncope.  The history is provided by the patient and medical records.  Headache   Past Medical History:  Diagnosis Date   Anemia    Anemia 01/10/2019   CBC Latest Ref Rng & Units 06/21/2020 01/11/2019 01/10/2019 WBC 3.4 - 10.8 x10E3/uL 4.4 9.6 6.9 Hemoglobin 11.1 - 15.9 g/dL 78.2 10.7(L) 11.8(L) Hematocrit 34.0 - 46.6 % 34.5 32.7(L) 35.6(L) Platelets 150 - 450 x10E3/uL 150 228 220     Patient Active Problem List   Diagnosis Date Noted   Abnormal antibody titer 07/19/2020    Past Surgical History:  Procedure Laterality Date   NO PAST SURGERIES      OB History     Gravida  4   Para  3   Term  3   Preterm      AB      Living  3      SAB      IAB      Ectopic      Multiple  0   Live Births  3            Home Medications    Prior to Admission medications   Not on File    Family History Family History  Problem Relation Age of Onset   Healthy Mother    Healthy Father     Social History Social History   Tobacco Use   Smoking status: Never   Smokeless tobacco: Never  Vaping Use   Vaping status: Never Used  Substance Use Topics   Alcohol use: Never   Drug use: Never     Allergies   Patient has no known allergies.   Review of  Systems Review of Systems  Per HPI  Physical Exam Triage Vital Signs ED Triage Vitals  Encounter Vitals Group     BP 11/26/23 1956 112/71     Systolic BP Percentile --      Diastolic BP Percentile --      Pulse Rate 11/26/23 1956 81     Resp 11/26/23 1956 18     Temp 11/26/23 1956 98.5 F (36.9 C)     Temp Source 11/26/23 1956 Oral     SpO2 11/26/23 1956 98 %     Weight --      Height --      Head Circumference --      Peak Flow --      Pain Score 11/26/23 1954 8     Pain Loc --      Pain Education --      Exclude from Growth Chart --    No data found.  Updated Vital Signs BP 112/71 (BP Location: Left Arm)   Pulse  81   Temp 98.5 F (36.9 C) (Oral)   Resp 18   LMP 05/15/2023   SpO2 98%   Visual Acuity Right Eye Distance:   Left Eye Distance:   Bilateral Distance:    Right Eye Near:   Left Eye Near:    Bilateral Near:     Physical Exam Vitals and nursing note reviewed.  Constitutional:      Appearance: Normal appearance.  HENT:     Head: Normocephalic and atraumatic.     Right Ear: External ear normal.     Left Ear: External ear normal.     Nose: Nose normal.     Mouth/Throat:     Mouth: Mucous membranes are moist.  Eyes:     Conjunctiva/sclera: Conjunctivae normal.  Cardiovascular:     Rate and Rhythm: Normal rate.  Pulmonary:     Effort: Pulmonary effort is normal. No respiratory distress.  Abdominal:     General: Bowel sounds are normal.     Tenderness: There is no abdominal tenderness. There is no guarding.  Neurological:     General: No focal deficit present.     Mental Status: She is alert.  Psychiatric:        Mood and Affect: Mood normal.      UC Treatments / Results  Labs (all labs ordered are listed, but only abnormal results are displayed) Labs Reviewed  POCT URINE PREGNANCY - Abnormal; Notable for the following components:      Result Value   Preg Test, Ur Positive (*)    All other components within normal limits  POCT  FASTING CBG [facility] MANUAL ENTRY    EKG   Radiology No results found.  Procedures Procedures (including critical care time)  Medications Ordered in UC Medications - No data to display  Initial Impression / Assessment and Plan / UC Course  I have reviewed the triage vital signs and the nursing notes.  Pertinent labs & imaging results that were available during my care of the patient were reviewed by me and considered in my medical decision making (see chart for details).  Vitals in triage reviewed, patient is hemodynamically stable.  Abdominal exam consistent with pregnancy, without rebound, guarding or signs of acute abdomen.  CBG is normal in clinic.  Urine pregnancy positive.  Discussed that in order to fully evaluate her symptoms, patient would need evaluation of her pregnancy and that is done at MAU.  Patient agreeable to plan, will head to MAU via POV.     Final Clinical Impressions(s) / UC Diagnoses   Final diagnoses:  Right lower quadrant abdominal pain  Dizziness     Discharge Instructions      Please head to the maternity assessment unit for further evaluation of your symptoms.    ED Prescriptions   None    PDMP not reviewed this encounter.   Harlow Lighter, Ronell Boldin  N, FNP 11/26/23 2033

## 2023-11-26 NOTE — MAU Note (Addendum)
 Pt says H/A - started at 0800 - 8/10. No meds all day  [facility]- clinic. Pain now 8/10. No UC- feels lower abd pain - constant - started 4pm-  no meds 6/10 Feels baby moving

## 2023-11-26 NOTE — ED Triage Notes (Signed)
 Patient is pregnant.

## 2023-11-26 NOTE — ED Notes (Signed)
 Patient is being discharged from the Urgent Care and sent to the Emergency Department via pov . Per [place]  Harlow Lighter, NP, patient is in need of higher level of care due to limited resources and abdominal pain in OB patient. Patient is aware and verbalizes understanding of plan of care.  Vitals:   11/26/23 1956  BP: 112/71  Pulse: 81  Resp: 18  Temp: 98.5 F (36.9 C)  SpO2: 98%

## 2023-11-26 NOTE — MAU Provider Note (Signed)
 Chief Complaint:  Headache   Event Date/Time   First Provider Initiated Contact with Patient 11/26/23 2115     HPI: Sierra Thomas is a 33 y.o. G4P3003 at 1w6dwho presents to maternity admissions reporting headache since this morning.  Has not tried any thing for it.  Has sone intermittent RLQ pain when walking. Does not hurt when lying still  Also c/o some days with lower appetite in mornings.  No nausea associated with that. . She reports good fetal movement, denies LOF, vaginal bleeding, dizziness, n/v, diarrhea, constipation or fever/chills.   Has not had any prenatal care yet;Aaron Aas  Missed several appts early preg, but has a new OB this week.   Headache  This is a new problem. The current episode started today. The problem occurs constantly. The pain is located in the Frontal region. The pain does not radiate. The quality of the pain is described as aching and dull. Pertinent negatives include no back pain, blurred vision, coughing, dizziness, fever, muscle aches, nausea or visual change. Nothing aggravates the symptoms. She has tried nothing for the symptoms.   RN Note: Pt says H/A - started at 0800 - 8/10. No meds all day  [facility]- clinic. Pain now 8/10. No UC- feels lower abd pain - constant - started 4pm-  no meds 6/10 Feels baby moving  Past Medical History: Past Medical History:  Diagnosis Date   Anemia    Anemia 01/10/2019   CBC Latest Ref Rng & Units 06/21/2020 01/11/2019 01/10/2019 WBC 3.4 - 10.8 x10E3/uL 4.4 9.6 6.9 Hemoglobin 11.1 - 15.9 g/dL 16.1 10.7(L) 11.8(L) Hematocrit 34.0 - 46.6 % 34.5 32.7(L) 35.6(L) Platelets 150 - 450 x10E3/uL 150 228 220     Past obstetric history: OB History  Gravida Para Term Preterm AB Living  4 3 3   3   SAB IAB Ectopic Multiple Live Births     0 3    # Outcome Date GA Lbr Len/2nd Weight Sex Type Anes PTL Lv  4 Current           3 Term 06/23/22 [redacted]w[redacted]d 03:38 3600 g M Vag-Spont None  LIV  2 Term 09/24/20 [redacted]w[redacted]d / 00:09 3481 g M Vag-Spont  None  LIV     Birth Comments: none  1 Term 01/10/19 [redacted]w[redacted]d 04:47 / 01:05 3226 g M Vag-Spont None  LIV     Birth Comments: wnl    Past Surgical History: Past Surgical History:  Procedure Laterality Date   NO PAST SURGERIES      Family History: Family History  Problem Relation Age of Onset   Healthy Mother    Healthy Father     Social History: Social History   Tobacco Use   Smoking status: Never   Smokeless tobacco: Never  Vaping Use   Vaping status: Never Used  Substance Use Topics   Alcohol use: Never   Drug use: Never    Allergies: No Known Allergies  Meds:  No medications prior to admission.    I have reviewed patient's Past Medical Hx, Surgical Hx, Family Hx, Social Hx, medications and allergies.   ROS:  Review of Systems  Constitutional:  Negative for fever.  Eyes:  Negative for blurred vision.  Respiratory:  Negative for cough.   Gastrointestinal:  Negative for nausea.  Musculoskeletal:  Negative for back pain.  Neurological:  Positive for headaches. Negative for dizziness.   Other systems negative  Physical Exam  Patient Vitals for the past 24 hrs:  BP Temp Temp src  Pulse Resp Weight  11/26/23 2057 102/64 97.9 F (36.6 C) Oral 80 12 68.9 kg   Constitutional: Well-developed, well-nourished female in no acute distress.  Cardiovascular: normal rate  Respiratory: normal effort GI: Abd soft, non-tender, gravid appropriate for gestational age.   MS: Extremities nontender, no edema, normal ROM Neurologic: Alert and oriented x 4. No deficits noted GU: Neg CVAT.   FHT:  Baseline 130 , moderate variability, accelerations present, no decelerations Contractions:   Rare   Labs: Results for orders placed or performed during the hospital encounter of 11/26/23 (from the past 24 hours)  POC CBG monitoring     Status: None   Collection Time: 11/26/23  8:16 PM  Result Value Ref Range   POCT Glucose ([facility]) 82 70 - 99 mg/dL  POCT urine pregnancy     Status:  Abnormal   Collection Time: 11/26/23  8:24 PM  Result Value Ref Range   Preg Test, Ur Positive (A) Negative     Imaging:  No results found.  MAU Course/MDM: I have reviewed the triage vital signs and the nursing notes.   Pertinent labs & imaging results that were available during my care of the patient were reviewed by me and considered in my medical decision making (see chart for details).      I have reviewed her medical records including past results, notes and treatments.   I have ordered labs and reviewed results.  NST reviewed Treatments in MAU included Excedrin  Tension which completely alleviated headache..    Assessment: Single IUP at [redacted]w[redacted]d Headache in pregnancy  Plan: Discharge home Rx Excedrin  Tension prn headache Preterm Labor precautions and fetal kick counts Follow up in Office for prenatal visits  Encouraged to return if she develops worsening of symptoms, increase in pain, fever, or other concerning symptoms.   Pt stable at time of discharge.  Holmes Lusher CNM, MSN Certified Nurse-Midwife 11/26/2023 9:15 PM

## 2023-11-26 NOTE — ED Triage Notes (Signed)
 Complains of headache and dizziness today.  Denies any cough, cold or runny nose type symptoms.  Patient reports she has felt queasy.  Patient reports she has not had any medications for symptoms.

## 2023-11-26 NOTE — Discharge Instructions (Addendum)
 Please head to the maternity assessment unit for further evaluation of your symptoms.

## 2023-11-28 ENCOUNTER — Other Ambulatory Visit: Payer: Self-pay

## 2023-11-28 ENCOUNTER — Ambulatory Visit: Admitting: *Deleted

## 2023-11-28 VITALS — BP 104/67 | HR 82 | Wt 154.0 lb

## 2023-11-28 DIAGNOSIS — O0933 Supervision of pregnancy with insufficient antenatal care, third trimester: Secondary | ICD-10-CM

## 2023-11-28 DIAGNOSIS — O0993 Supervision of high risk pregnancy, unspecified, third trimester: Secondary | ICD-10-CM

## 2023-11-28 DIAGNOSIS — Z3A28 28 weeks gestation of pregnancy: Secondary | ICD-10-CM | POA: Diagnosis not present

## 2023-11-28 DIAGNOSIS — O0990 Supervision of high risk pregnancy, unspecified, unspecified trimester: Secondary | ICD-10-CM

## 2023-11-28 DIAGNOSIS — O0930 Supervision of pregnancy with insufficient antenatal care, unspecified trimester: Secondary | ICD-10-CM

## 2023-11-28 MED ORDER — PRENATAL PLUS VITAMIN/MINERAL 27-1 MG PO TABS: 1.0000 | ORAL_TABLET | Freq: Every day | ORAL | 11 refills | Status: AC

## 2023-11-28 MED ORDER — BLOOD PRESSURE KIT DEVI: 1.0000 | 0 refills | Status: DC

## 2023-11-28 NOTE — Progress Notes (Signed)
 New OB Intake  I connected with Evan Hillock  on 11/28/23 at  3:15 PM EDT in person.    I explained I am completing New OB Intake today. We discussed EDD of 02/19/2024, by Last Menstrual Period. Pt is G4P3003. I reviewed her allergies, medications and Medical/Surgical/OB history.    Patient Active Problem List   Diagnosis Date Noted   Late prenatal care 11/28/2023   Supervision of high risk pregnancy, antepartum 11/28/2023   Abnormal antibody titer 07/19/2020     Concerns addressed today  Delivery Plans Plans to deliver at [facility]. Discussed the nature of our practice with multiple providers including residents and students. Due to the size of the practice, the delivering provider may not be the same as those providing prenatal care.   Patient is not interested in water birth.  MyChart/Babyscripts MyChart access verified but she does not use it.  I explained pt will have some visits in office and some virtually.   Blood Pressure Cuff/Weight Scale Blood pressure cuff ordered for patient and patient will bring to new OB visit for education on how to use and record blood pressure.   Anatomy US  Explained that Anatomy US  scheduled for June 9th.   Is patient a CenteringPregnancy candidate?  Not a Candidate   Is patient a Mom+Baby Combined Care candidate?  Not a candidate    Is patient a candidate for Babyscripts Optimization? No, due to language barrier    First visit review I reviewed new OB appt with patient. Explained pt will be seen by Kirstie Percy, PA at first visit. Discussed Linard [place] genetic screening with patient. Patient would like the  Panorama . To be collected with new ob visit. She had Routine prenatal labs collected today.    Last Pap Diagnosis  Date Value Ref Range Status  05/19/2022   Final   - Negative for intraepithelial lesion or malignancy (NILM)    Arnell Lange, RN 11/28/2023  4:22 PM

## 2023-11-29 LAB — CBC/D/PLT+RPR+RH+ABO+RUBIGG...
Antibody Screen: NEGATIVE
Basophils Absolute: 0 10*3/uL (ref 0.0–0.2)
Basos: 0 %
EOS (ABSOLUTE): 0.1 10*3/uL (ref 0.0–0.4)
Eos: 1 %
HCV Ab: NONREACTIVE
HIV Screen 4th Generation wRfx: NONREACTIVE
Hematocrit: 35 % (ref 34.0–46.6)
Hemoglobin: 12 g/dL (ref 11.1–15.9)
Hepatitis B Surface Ag: NEGATIVE
Immature Grans (Abs): 0 10*3/uL (ref 0.0–0.1)
Immature Granulocytes: 0 %
Lymphocytes Absolute: 1.1 10*3/uL (ref 0.7–3.1)
Lymphs: 23 %
MCH: 33 pg (ref 26.6–33.0)
MCHC: 34.3 g/dL (ref 31.5–35.7)
MCV: 96 fL (ref 79–97)
Monocytes Absolute: 0.2 10*3/uL (ref 0.1–0.9)
Monocytes: 4 %
Neutrophils Absolute: 3.5 10*3/uL (ref 1.4–7.0)
Neutrophils: 72 %
Platelets: 190 10*3/uL (ref 150–450)
RBC: 3.64 x10E6/uL — ABNORMAL LOW (ref 3.77–5.28)
RDW: 12.6 % (ref 11.7–15.4)
RPR Ser Ql: NONREACTIVE
Rh Factor: POSITIVE
Rubella Antibodies, IGG: 5.42 {index} (ref >0.99)
WBC: 4.9 10*3/uL (ref 3.4–10.8)

## 2023-11-29 LAB — HCV INTERPRETATION

## 2023-11-30 LAB — URINE CULTURE, OB REFLEX

## 2023-11-30 LAB — CULTURE, OB URINE

## 2023-12-02 NOTE — Progress Notes (Unsigned)
 PRENATAL VISIT NOTE  Subjective:  Sierra Thomas is a 33 y.o. [phone] at [redacted]w[redacted]d being seen today for her first prenatal visit for this pregnancy.  She is currently monitored for the following issues for this {Blank single:19197::"high-risk","low-risk"} pregnancy and has Abnormal antibody titer; Late prenatal care; and Supervision of high risk pregnancy, antepartum on their problem list.  Patient reports {sx:14538}.   .  .   . Denies leaking of fluid.   She is planning to {Blank single:19197::"breastfeed","bottle feed"}. Desires *** for contraception.   The following portions of the patient's history were reviewed and updated as appropriate: allergies, current medications, past family history, past medical history, past social history, past surgical history and problem list.   Objective:  There were no vitals filed for this visit.  Fetal Status:           General:  Alert, oriented and cooperative. Patient is in no acute distress.  Skin: Skin is warm and dry. No rash noted.   Cardiovascular: Normal heart rate and rhythm noted  Respiratory: Normal respiratory effort, no problems with respiration noted. Clear to auscultation.   Abdomen: Soft, gravid, appropriate for gestational age. Normal bowel sounds. Non-tender.       Pelvic: {Blank single:19197::"Cervical exam performed","Cervical exam deferred"}       Normal cervical contour, no lesions, no bleeding following pap, normal discharge  Extremities: Normal range of motion.     Mental Status: Normal mood and affect. Normal behavior. Normal judgment and thought content.    Indications for ASA therapy (per uptodate) One of the following: Previous pregnancy with preeclampsia, especially early onset and with an adverse outcome {yes/no:20286} Multifetal gestation {yes/no:20286} Chronic hypertension {yes/no:20286} Type 1 or 2 diabetes mellitus {yes/no:20286} Chronic kidney disease {yes/no:20286} Autoimmune disease (antiphospholipid  syndrome, systemic lupus erythematosus) {yes/no:20286}  Two or more of the following: Nulliparity {yes/no:20286} Obesity (body mass index >30 kg/m2) {yes/no:20286} Family history of preeclampsia in mother or sister {yes/no:20286} Age >=35 years {yes/no:20286} Sociodemographic characteristics (African American race, low socioeconomic level) {yes/no:20286} Personal risk factors (eg, previous pregnancy with low birth weight or small for gestational age infant, previous adverse pregnancy outcome [eg, stillbirth], interval >10 years between pregnancies) {yes/no:20286}  Indications for early GDM screening  First-degree relative with diabetes {yes/no:20286} BMI >30kg/m2 {yes/no:20286} Age > 35 {yes/no:20286} Previous birth of an infant weighing >=4000 g {yes/no:20286} Gestational diabetes mellitus in a previous pregnancy {yes/no:20286} Glycated hemoglobin >=5.7 percent (39 mmol/mol), impaired glucose tolerance, or impaired fasting glucose on previous testing {yes/no:20286} High-risk race/ethnicity (eg, African American, Latino, Native American, [place] American, [place] Islander) {yes/no:20286} Previous stillbirth of unknown cause {yes/no:20286} Maternal birthweight > 9 lbs {yes/no:20286} History of cardiovascular disease {yes/no:20286} Hypertension or on therapy for hypertension {yes/no:20286} High-density lipoprotein cholesterol level <35 mg/dL (4.54 mmol/L) and/or a triglyceride level >250 mg/dL (0.98 mmol/L) {JXB/JY:78295} Polycystic ovary syndrome {yes/no:20286} Physical inactivity {yes/no:20286} Other clinical condition associated with insulin resistance (eg, severe obesity, acanthosis nigricans) {yes/no:20286} Current use of glucocorticoids {yes/no:20286}   Early screening tests: FBS, A1C, Random CBG, glucose challenge  Assessment and Plan:  Pregnancy: G4P3003 at [redacted]w[redacted]d  1. Supervision of high risk pregnancy, antepartum (Primary) 2. Late prenatal care Initial labs drawn. Continue  prenatal vitamins. Genetic Screening discussed: NIPS, carrier screening and AFP *** Ultrasound discussed; fetal anatomic survey: *** Problem list reviewed and updated. Reviewed Brx optimized schedule, patient agreeable The nature of [facility] with multiple MDs and other Advanced Practice Providers was explained to patient; also emphasized that residents, students  are part of our team. Routine obstetric precautions reviewed.   3. [redacted] weeks gestation of pregnancy Discussed fasting for GTT/28 week labs next visit  {Blank single:19197::"Term","Preterm"} labor/first trimester warning symptoms and general obstetric precautions including but not limited to vaginal bleeding, contractions, leaking of fluid and fetal movement were reviewed in detail with the patient.  Please refer to After Visit Summary for other counseling recommendations.   No follow-ups on file.  Future Appointments  Date Time Provider Department Center  12/03/2023  9:55 AM Thomas Rhude E, PA-C [facility]  01/07/2024  2:00 PM [facility] PROVIDER 1 [facility]  01/07/2024  2:30 PM [facility] US2 [facility]    Luevenia Saha, PA-C

## 2023-12-03 ENCOUNTER — Encounter: Payer: Self-pay | Admitting: Physician Assistant

## 2023-12-03 ENCOUNTER — Other Ambulatory Visit: Payer: Self-pay

## 2023-12-03 ENCOUNTER — Other Ambulatory Visit (HOSPITAL_COMMUNITY)
Admission: RE | Admit: 2023-12-03 | Discharge: 2023-12-03 | Disposition: A | Discharge status: Home / Self Care | Source: Ambulatory Visit | Attending: Physician Assistant | Admitting: Physician Assistant

## 2023-12-03 ENCOUNTER — Ambulatory Visit (INDEPENDENT_AMBULATORY_CARE_PROVIDER_SITE_OTHER): Payer: Self-pay | Admitting: Physician Assistant

## 2023-12-03 VITALS — BP 110/63 | HR 73 | Wt 151.9 lb

## 2023-12-03 DIAGNOSIS — O0990 Supervision of high risk pregnancy, unspecified, unspecified trimester: Secondary | ICD-10-CM

## 2023-12-03 DIAGNOSIS — Z23 Encounter for immunization: Secondary | ICD-10-CM

## 2023-12-03 DIAGNOSIS — Z3A28 28 weeks gestation of pregnancy: Secondary | ICD-10-CM | POA: Diagnosis not present

## 2023-12-03 DIAGNOSIS — O0933 Supervision of pregnancy with insufficient antenatal care, third trimester: Secondary | ICD-10-CM

## 2023-12-03 DIAGNOSIS — O0930 Supervision of pregnancy with insufficient antenatal care, unspecified trimester: Secondary | ICD-10-CM

## 2023-12-03 DIAGNOSIS — O0993 Supervision of high risk pregnancy, unspecified, third trimester: Secondary | ICD-10-CM

## 2023-12-03 LAB — HEMOGLOBIN A1C
Est. average glucose Bld gHb Est-mCnc: 80 mg/dL
Hgb A1c MFr Bld: 4.4 % — ABNORMAL LOW (ref 4.8–5.6)

## 2023-12-04 LAB — GC/CHLAMYDIA PROBE AMP (~~LOC~~) NOT AT ARMC
Chlamydia: NEGATIVE
Comment: NEGATIVE
Comment: NORMAL
Neisseria Gonorrhea: NEGATIVE

## 2023-12-08 LAB — PANORAMA PRENATAL TEST FULL PANEL:PANORAMA TEST PLUS 5 ADDITIONAL MICRODELETIONS: FETAL FRACTION: 12.7

## 2023-12-09 ENCOUNTER — Encounter: Payer: Self-pay | Admitting: Physician Assistant

## 2023-12-13 ENCOUNTER — Ambulatory Visit: Payer: Self-pay | Admitting: Physician Assistant

## 2023-12-13 ENCOUNTER — Other Ambulatory Visit: Payer: Self-pay

## 2023-12-13 DIAGNOSIS — O0990 Supervision of high risk pregnancy, unspecified, unspecified trimester: Secondary | ICD-10-CM

## 2023-12-13 LAB — HORIZON CUSTOM: REPORT SUMMARY: NEGATIVE

## 2023-12-21 ENCOUNTER — Other Ambulatory Visit: Payer: Self-pay

## 2023-12-21 ENCOUNTER — Other Ambulatory Visit

## 2023-12-21 ENCOUNTER — Encounter: Payer: Self-pay | Admitting: Obstetrics & Gynecology

## 2023-12-21 ENCOUNTER — Ambulatory Visit: Payer: Self-pay | Admitting: Obstetrics & Gynecology

## 2023-12-21 VITALS — BP 110/71 | HR 83 | Wt 159.0 lb

## 2023-12-21 DIAGNOSIS — Z758 Other problems related to medical facilities and other health care: Secondary | ICD-10-CM | POA: Diagnosis not present

## 2023-12-21 DIAGNOSIS — Z603 Acculturation difficulty: Secondary | ICD-10-CM | POA: Diagnosis not present

## 2023-12-21 DIAGNOSIS — Z3A31 31 weeks gestation of pregnancy: Secondary | ICD-10-CM

## 2023-12-21 DIAGNOSIS — O0933 Supervision of pregnancy with insufficient antenatal care, third trimester: Secondary | ICD-10-CM

## 2023-12-21 DIAGNOSIS — O0990 Supervision of high risk pregnancy, unspecified, unspecified trimester: Secondary | ICD-10-CM

## 2023-12-21 DIAGNOSIS — O0930 Supervision of pregnancy with insufficient antenatal care, unspecified trimester: Secondary | ICD-10-CM

## 2023-12-21 NOTE — Progress Notes (Signed)
 At checkout patient asked fro someone to show her how to use BP cuff. Nurse took patient in room and demonstrated how to take bp and reviewed guidelines. She voices understanding. Sierra Thomas

## 2023-12-21 NOTE — Progress Notes (Signed)
   PRENATAL VISIT NOTE  Subjective:  Sierra Thomas is a 33 y.o. [phone] at [redacted]w[redacted]d being seen today for ongoing prenatal care.  She is currently monitored for the following issues for this high-risk pregnancy and has Abnormal antibody titer; Late prenatal care; Supervision of high risk pregnancy, antepartum; and Language barrier affecting health care on their problem list.  Patient reports no complaints.  Contractions: Not present. Vag. Bleeding: None.  Movement: Present. Denies leaking of fluid.   The following portions of the patient's history were reviewed and updated as appropriate: allergies, current medications, past family history, past medical history, past social history, past surgical history and problem list.   Objective:    Vitals:   12/21/23 0911  BP: 110/71  Pulse: 83  Weight: 159 lb (72.1 kg)    Fetal Status:  Fetal Heart Rate (bpm): 147   Movement: Present    General: Alert, oriented and cooperative. Patient is in no acute distress.  Skin: Skin is warm and dry. No rash noted.   Cardiovascular: Normal heart rate noted  Respiratory: Normal respiratory effort, no problems with respiration noted  Abdomen: Soft, gravid, appropriate for gestational age.  Pain/Pressure: Absent     Pelvic: Cervical exam deferred        Extremities: Normal range of motion.  Edema: Trace  Mental Status: Normal mood and affect. Normal behavior. Normal judgment and thought content.   Assessment and Plan:  Pregnancy: G4P3003 at [redacted]w[redacted]d 1. Supervision of high risk pregnancy, antepartum (Primary)   2. Late prenatal care   3. [redacted] weeks gestation of pregnancy No orders of the defined types were placed in this encounter.  2 hr GTT  4. Language barrier affecting health care Swahili interpreter present  Preterm labor symptoms and general obstetric precautions including but not limited to vaginal bleeding, contractions, leaking of fluid and fetal movement were reviewed in detail with the  patient. Please refer to After Visit Summary for other counseling recommendations.   Return in about 2 weeks (around 01/04/2024).  Future Appointments  Date Time Provider Department Center  01/07/2024  2:00 PM [facility] PROVIDER 1 [facility]  01/07/2024  2:30 PM [facility] US2 [facility]    Onnie Bilis, MD

## 2023-12-22 LAB — CBC
Hematocrit: 34.7 % (ref 34.0–46.6)
Hemoglobin: 11.4 g/dL (ref 11.1–15.9)
MCH: 32.4 pg (ref 26.6–33.0)
MCHC: 32.9 g/dL (ref 31.5–35.7)
MCV: 99 fL — ABNORMAL HIGH (ref 79–97)
Platelets: 162 10*3/uL (ref 150–450)
RBC: 3.52 x10E6/uL — ABNORMAL LOW (ref 3.77–5.28)
RDW: 12.7 % (ref 11.7–15.4)
WBC: 4 10*3/uL (ref 3.4–10.8)

## 2023-12-22 LAB — GLUCOSE TOLERANCE, 2 HOURS W/ 1HR
Glucose, 1 hour: 112 mg/dL (ref 70–179)
Glucose, 2 hour: 82 mg/dL (ref 70–152)
Glucose, Fasting: 70 mg/dL (ref 70–91)

## 2023-12-22 LAB — RPR: RPR Ser Ql: NONREACTIVE

## 2023-12-22 LAB — HIV ANTIBODY (ROUTINE TESTING W REFLEX): HIV Screen 4th Generation wRfx: NONREACTIVE

## 2024-01-04 ENCOUNTER — Other Ambulatory Visit: Payer: Self-pay

## 2024-01-04 ENCOUNTER — Ambulatory Visit (INDEPENDENT_AMBULATORY_CARE_PROVIDER_SITE_OTHER): Admitting: Certified Nurse Midwife

## 2024-01-04 VITALS — BP 108/70 | HR 82 | Wt 157.5 lb

## 2024-01-04 DIAGNOSIS — R252 Cramp and spasm: Secondary | ICD-10-CM | POA: Diagnosis not present

## 2024-01-04 DIAGNOSIS — O0933 Supervision of pregnancy with insufficient antenatal care, third trimester: Secondary | ICD-10-CM | POA: Diagnosis not present

## 2024-01-04 DIAGNOSIS — O26893 Other specified pregnancy related conditions, third trimester: Secondary | ICD-10-CM | POA: Diagnosis not present

## 2024-01-04 DIAGNOSIS — Z3A33 33 weeks gestation of pregnancy: Secondary | ICD-10-CM

## 2024-01-04 DIAGNOSIS — Z758 Other problems related to medical facilities and other health care: Secondary | ICD-10-CM

## 2024-01-04 DIAGNOSIS — O0993 Supervision of high risk pregnancy, unspecified, third trimester: Secondary | ICD-10-CM

## 2024-01-04 DIAGNOSIS — O99891 Other specified diseases and conditions complicating pregnancy: Secondary | ICD-10-CM

## 2024-01-04 DIAGNOSIS — Z3490 Encounter for supervision of normal pregnancy, unspecified, unspecified trimester: Secondary | ICD-10-CM

## 2024-01-04 DIAGNOSIS — O0930 Supervision of pregnancy with insufficient antenatal care, unspecified trimester: Secondary | ICD-10-CM

## 2024-01-04 NOTE — Progress Notes (Addendum)
   PRENATAL VISIT NOTE  Subjective:  Sierra Thomas is a 33 y.o. [phone] at [redacted]w[redacted]d being seen today for ongoing prenatal care.  She is currently monitored for the following issues for this low-risk pregnancy and has Antibody screen in April 2025 was negative; Late prenatal care; Supervision of high risk pregnancy, antepartum; and Language barrier affecting health care on their problem list.  Patient reports bilateral lower leg cramping 2-3 times per week at bedtime. She denies any acute pain now with palpation.   . Vag. Bleeding: None.  Movement: Present. Denies leaking of fluid.   The following portions of the patient's history were reviewed and updated as appropriate: allergies, current medications, past family history, past medical history, past social history, past surgical history and problem list.   Objective:    Vitals:   01/04/24 0824  BP: 108/70  Pulse: 82  Weight: 71.4 kg    Fetal Status:  Fetal Heart Rate (bpm): 143 Fundal Height: 33 cm Movement: Present    General: Alert, oriented and cooperative. Patient is in no acute distress.  Skin: Skin is warm and dry. No rash noted.   Cardiovascular: Normal heart rate noted  Respiratory: Normal respiratory effort, no problems with respiration noted  Abdomen: Soft, gravid, appropriate for gestational age.  Pain/Pressure: Absent     Pelvic: Cervical exam deferred        Extremities: Normal range of motion.  Edema: Trace  Mental Status: Normal mood and affect. Normal behavior. Normal judgment and thought content.   Assessment and Plan:  Pregnancy: G4P3003 at [redacted]w[redacted]d 1. Supervision of high risk pregnancy in third trimester (Primary) - Doing well overall. +FM  2. [redacted] weeks gestation of pregnancy - Routine OB care  3. Late prenatal care 4. Pregnancy with normal glucose tolerance test (GTT)  5. Leg cramps in pregnancy - Recommended increasing water intake to hydrate muscles. - Demonstrated leg and foot stretches to do before  going to bed. - Discussed leg pain/DVT warning signs to report like swelling, redness, and pain.   Preterm labor symptoms and general obstetric precautions including but not limited to vaginal bleeding, contractions, leaking of fluid and fetal movement were reviewed in detail with the patient. Please refer to After Visit Summary for other counseling recommendations.     Future Appointments  Date Time Provider Department Center  01/07/2024  2:00 PM [facility] PROVIDER 1 [facility]  01/07/2024  2:30 PM [facility] US2 [facility]  01/21/2024  8:55 AM Zelma Hidden, FNP [facility]  02/04/2024  8:15 AM Noreene Bearded, PA [facility]  02/11/2024  2:35 PM Ebony Goldstein, MD [facility]  02/18/2024 10:55 AM Noreene Bearded, PA [facility]    Wilford Hanks, Student-MidWife

## 2024-01-07 ENCOUNTER — Ambulatory Visit: Attending: Family Medicine

## 2024-01-07 ENCOUNTER — Other Ambulatory Visit: Payer: Self-pay | Admitting: *Deleted

## 2024-01-07 ENCOUNTER — Ambulatory Visit: Admitting: Maternal & Fetal Medicine

## 2024-01-07 VITALS — BP 103/62 | HR 81

## 2024-01-07 DIAGNOSIS — O0990 Supervision of high risk pregnancy, unspecified, unspecified trimester: Secondary | ICD-10-CM | POA: Diagnosis present

## 2024-01-07 DIAGNOSIS — O0933 Supervision of pregnancy with insufficient antenatal care, third trimester: Secondary | ICD-10-CM

## 2024-01-07 DIAGNOSIS — Z3A33 33 weeks gestation of pregnancy: Secondary | ICD-10-CM

## 2024-01-07 DIAGNOSIS — O0930 Supervision of pregnancy with insufficient antenatal care, unspecified trimester: Secondary | ICD-10-CM | POA: Insufficient documentation

## 2024-01-07 DIAGNOSIS — Z3687 Encounter for antenatal screening for uncertain dates: Secondary | ICD-10-CM | POA: Diagnosis not present

## 2024-01-07 DIAGNOSIS — Z3493 Encounter for supervision of normal pregnancy, unspecified, third trimester: Secondary | ICD-10-CM | POA: Insufficient documentation

## 2024-01-07 DIAGNOSIS — Z363 Encounter for antenatal screening for malformations: Secondary | ICD-10-CM | POA: Diagnosis not present

## 2024-01-07 DIAGNOSIS — Z3490 Encounter for supervision of normal pregnancy, unspecified, unspecified trimester: Secondary | ICD-10-CM | POA: Insufficient documentation

## 2024-01-07 DIAGNOSIS — Z3A36 36 weeks gestation of pregnancy: Secondary | ICD-10-CM | POA: Insufficient documentation

## 2024-01-07 DIAGNOSIS — Z3689 Encounter for other specified antenatal screening: Secondary | ICD-10-CM | POA: Insufficient documentation

## 2024-01-07 NOTE — Progress Notes (Signed)
 Patient information  Patient Name: Sierra Thomas  Patient MRN:   [id number]  Referring practice: MFM Referring Provider: [facility])  Problem List   Patient Active Problem List   Diagnosis Date Noted   Uncertain dates, antepartum 01/07/2024   Language barrier affecting health care 12/21/2023   Late prenatal care 11/28/2023   Supervision of high risk pregnancy, antepartum 11/28/2023   Maternal Fetal medicine Consult  Sierra Thomas is a 33 y.o. G4P3003 at [redacted]w[redacted]d here for ultrasound and consultation. Sierra Thomas is doing well today with no acute concerns. Today we focused on the following:   Late prenatal care: Care after 30 weeks. She has no complaints today. She passed her GTT and had low risk NIPT.   Uncertain dates: The patient reports that her menstrual cycles range between 12 and 30 days.  For this reason we will read at her pregnancy based on today's ultrasound which is the only ultrasound showing a fetus with a heartbeat.  She had an early ultrasound around 5 weeks 0 days gestation that only showed a gestational sac and therefore we cannot use it to date her pregnancy.  She passed her GTT and also has no history of large babies and this is more consistent with her current pregnancy.  Sonographic findings Single intrauterine pregnancy at 33w 6d  Fetal cardiac activity:  Observed and appears normal. Presentation: Cephalic. The anatomic structures that were well seen appear normal without evidence of soft markers. Due to poor acoustic windows some structures remain suboptimally visualized. Fetal biometry shows the estimated fetal weight at the 50 percentile.  Amniotic fluid: Within normal limits.  MVP: 5.49 cm. Placenta: Anterior. Adnexa: No abnormality visualized.  There are limitations of prenatal ultrasound such as the inability to detect certain abnormalities due to poor visualization. Various factors such as fetal  position, gestational age and maternal body habitus may increase the difficulty in visualizing the fetal anatomy.    Recommendations - EDD should be 02/03/2024 based on  U/S (01/07/24).  An in-person interpreter was used in the patient's preferred language for today's visit.    Review of Systems: A review of systems was performed and was negative except per HPI   Vitals and Physical Exam    01/07/2024    2:30 PM 01/04/2024    8:24 AM 12/21/2023    9:11 AM  Vitals with BMI  Weight  157 lbs 8 oz 159 lbs  Systolic 103 108 409  Diastolic 62 70 71  Pulse 81 82 83    Sitting comfortably on the sonogram table Nonlabored breathing Normal rate and rhythm Abdomen is nontender  Past pregnancies OB History  Gravida Para Term Preterm AB Living  4 3 3  0 0 3  SAB IAB Ectopic Multiple Live Births  0 0 0 0 3    # Outcome Date GA Lbr Len/2nd Weight Sex Type Anes PTL Lv  4 Current           3 Term 06/23/22 [redacted]w[redacted]d 03:38 7 lb 15 oz (3.6 kg) M Vag-Spont None  LIV  2 Term 09/24/20 [redacted]w[redacted]d / 00:09 7 lb 10.8 oz (3.481 kg) M Vag-Spont None  LIV     Birth Comments: none  1 Term 01/10/19 [redacted]w[redacted]d 04:47 / 01:05 7 lb 1.8 oz (3.226 kg) M Vag-Spont None  LIV     Birth Comments: wnl     I spent 30 minutes reviewing the patients chart, including labs and images as well as  counseling the patient about her medical conditions. Greater than 50% of the time was spent in direct face-to-face patient counseling.  Sierra Thomas  MFM, [place]   01/07/2024  3:40 PM

## 2024-01-08 ENCOUNTER — Ambulatory Visit: Payer: Self-pay | Admitting: Family Medicine

## 2024-01-08 DIAGNOSIS — O0990 Supervision of high risk pregnancy, unspecified, unspecified trimester: Secondary | ICD-10-CM

## 2024-01-21 ENCOUNTER — Ambulatory Visit (INDEPENDENT_AMBULATORY_CARE_PROVIDER_SITE_OTHER): Admitting: Obstetrics and Gynecology

## 2024-01-21 ENCOUNTER — Other Ambulatory Visit (HOSPITAL_COMMUNITY)
Admission: RE | Admit: 2024-01-21 | Discharge: 2024-01-21 | Disposition: A | Discharge status: Home / Self Care | Source: Ambulatory Visit | Attending: Obstetrics and Gynecology | Admitting: Obstetrics and Gynecology

## 2024-01-21 ENCOUNTER — Other Ambulatory Visit: Payer: Self-pay

## 2024-01-21 VITALS — BP 103/69 | HR 79 | Wt 160.0 lb

## 2024-01-21 DIAGNOSIS — O0930 Supervision of pregnancy with insufficient antenatal care, unspecified trimester: Secondary | ICD-10-CM

## 2024-01-21 DIAGNOSIS — O0990 Supervision of high risk pregnancy, unspecified, unspecified trimester: Secondary | ICD-10-CM | POA: Diagnosis not present

## 2024-01-21 DIAGNOSIS — Z3A38 38 weeks gestation of pregnancy: Secondary | ICD-10-CM | POA: Diagnosis present

## 2024-01-21 DIAGNOSIS — Z113 Encounter for screening for infections with a predominantly sexual mode of transmission: Secondary | ICD-10-CM | POA: Insufficient documentation

## 2024-01-21 NOTE — Progress Notes (Addendum)
   PRENATAL VISIT NOTE  Subjective:  Sierra Thomas is a 33 y.o. G4P3003 at [redacted]w[redacted]d being seen today for ongoing prenatal care.  She is currently monitored for the following issues for this low-risk pregnancy and has Late prenatal care; Supervision of high risk pregnancy, antepartum; Language barrier affecting health care; and Uncertain dates, antepartum on their problem list.  Patient reports no complaints.  Contractions: Not present. Vag. Bleeding: None.  Movement: Present. Denies leaking of fluid.   The following portions of the patient's history were reviewed and updated as appropriate: allergies, current medications, past family history, past medical history, past social history, past surgical history and problem list.   Objective:    Vitals:   01/21/24 0958  BP: 103/69  Pulse: 79  Weight: 160 lb (72.6 kg)    Fetal Status:  Fetal Heart Rate (bpm): 143 Fundal Height: 37 cm Movement: Present Presentation: Vertex  General: Alert, oriented and cooperative. Patient is in no acute distress.  Skin: Skin is warm and dry. No rash noted.   Cardiovascular: Normal heart rate noted  Respiratory: Normal respiratory effort, no problems with respiration noted  Abdomen: Soft, gravid, appropriate for gestational age.  Pain/Pressure: Absent     Pelvic: Cervical exam deferred        Extremities: Normal range of motion.  Edema: None  Mental Status: Normal mood and affect. Normal behavior. Normal judgment and thought content.   Assessment and Plan:  Pregnancy: G4P3003 at [redacted]w[redacted]d 1. Supervision of high risk pregnancy, antepartum (Primary) BP and FHR normal Doing well, feeling regular movement    2. [redacted] weeks gestation of pregnancy 36 weeks swabs collected today  6/9 u/s normal growth  Labor precautions discussed Still considering her birth control options  3. Late prenatal care    Term labor symptoms and general obstetric precautions including but not limited to vaginal bleeding,  contractions, leaking of fluid and fetal movement were reviewed in detail with the patient. Please refer to After Visit Summary for other counseling recommendations.   Return in one week for routine prenatal   Future Appointments  Date Time Provider Department Center  02/04/2024  8:15 AM Wallace Joesph LABOR, [place] [facility]  02/11/2024  2:35 PM Jhonny Augustin BROCKS, MD [facility]  02/18/2024 10:55 AM Wallace Joesph LABOR, [place] [facility]    Nidia Daring, FNP

## 2024-01-22 LAB — GC/CHLAMYDIA PROBE AMP (~~LOC~~) NOT AT ARMC
Chlamydia: NEGATIVE
Comment: NEGATIVE
Comment: NORMAL
Neisseria Gonorrhea: NEGATIVE

## 2024-01-23 ENCOUNTER — Ambulatory Visit: Payer: Self-pay | Admitting: Obstetrics and Gynecology

## 2024-01-24 LAB — CULTURE, BETA STREP (GROUP B ONLY): Strep Gp B Culture: NEGATIVE

## 2024-01-28 ENCOUNTER — Other Ambulatory Visit: Payer: Self-pay

## 2024-01-28 ENCOUNTER — Ambulatory Visit: Admitting: Obstetrics & Gynecology

## 2024-01-28 VITALS — BP 104/68 | HR 74 | Wt 157.5 lb

## 2024-01-28 DIAGNOSIS — Z603 Acculturation difficulty: Secondary | ICD-10-CM

## 2024-01-28 DIAGNOSIS — Z3A39 39 weeks gestation of pregnancy: Secondary | ICD-10-CM | POA: Diagnosis not present

## 2024-01-28 DIAGNOSIS — O0990 Supervision of high risk pregnancy, unspecified, unspecified trimester: Secondary | ICD-10-CM

## 2024-01-28 DIAGNOSIS — O0930 Supervision of pregnancy with insufficient antenatal care, unspecified trimester: Secondary | ICD-10-CM

## 2024-01-28 DIAGNOSIS — O0933 Supervision of pregnancy with insufficient antenatal care, third trimester: Secondary | ICD-10-CM

## 2024-01-28 NOTE — Progress Notes (Unsigned)
   PRENATAL VISIT NOTE  Subjective:  Joni Colegrove is a 33 y.o. [id number] at [redacted]w[redacted]d being seen today for ongoing prenatal care.  She is currently monitored for the following issues for this low-risk pregnancy and has Late prenatal care; Supervision of high risk pregnancy, antepartum; Language barrier affecting health care; and Uncertain dates, antepartum on their problem list.  Patient reports no complaints.  Contractions: Not present. Vag. Bleeding: None.  Movement: Present. Denies leaking of fluid.   The following portions of the patient's history were reviewed and updated as appropriate: allergies, current medications, past family history, past medical history, past social history, past surgical history and problem list.   Objective:    Vitals:   01/28/24 1058  BP: 104/68  Pulse: 74  Weight: 157 lb 8 oz (71.4 kg)    Fetal Status:  Fetal Heart Rate (bpm): 145   Movement: Present Presentation: Vertex  General: Alert, oriented and cooperative. Patient is in no acute distress.  Skin: Skin is warm and dry. No rash noted.   Cardiovascular: Normal heart rate noted  Respiratory: Normal respiratory effort, no problems with respiration noted  Abdomen: Soft, gravid, appropriate for gestational age.  Pain/Pressure: Absent     Pelvic: Dilation: Closed Effacement (%): 20 Station: Ballotable  Extremities: Normal range of motion.  Edema: None  Mental Status: Normal mood and affect. Normal behavior. Normal judgment and thought content.   Assessment and Plan:  Pregnancy: G4P3003 at [redacted]w[redacted]d 1. Supervision of high risk pregnancy, antepartum (Primary)   2. Late prenatal care   3. Language barrier affecting health care Swahili  Term labor symptoms and general obstetric precautions including but not limited to vaginal bleeding, contractions, leaking of fluid and fetal movement were reviewed in detail with the patient. Please refer to After Visit Summary for other counseling recommendations.    Return in about 1 week (around 02/04/2024).  Future Appointments  Date Time Provider Department Center  01/30/2024  2:00 PM [facility] PROVIDER 1 [facility]  01/30/2024  2:30 PM [facility] US5 [facility]  02/04/2024  8:15 AM Wallace Joesph LABOR, PA [facility]  02/11/2024  2:35 PM Jhonny Augustin BROCKS, MD [facility]  02/18/2024 10:55 AM Wallace Joesph LABOR, PA [facility]    Lynwood Solomons, MD

## 2024-01-28 NOTE — Progress Notes (Unsigned)
[id number] 

## 2024-01-30 ENCOUNTER — Ambulatory Visit

## 2024-01-30 DIAGNOSIS — O0990 Supervision of high risk pregnancy, unspecified, unspecified trimester: Secondary | ICD-10-CM

## 2024-01-30 DIAGNOSIS — O0930 Supervision of pregnancy with insufficient antenatal care, unspecified trimester: Secondary | ICD-10-CM

## 2024-02-03 NOTE — Progress Notes (Unsigned)
   PRENATAL VISIT NOTE  Subjective:  Sierra Thomas is a 33 y.o. G4P3003 at [redacted]w[redacted]d being seen today for ongoing prenatal care.  She is currently monitored for the following issues for this low-risk pregnancy and has Late prenatal care; Supervision of high risk pregnancy, antepartum; Language barrier affecting health care; and Uncertain dates, antepartum on their problem list.  Patient reports {sx:14538}.   .  .   . Denies leaking of fluid.   The following portions of the patient's history were reviewed and updated as appropriate: allergies, current medications, past family history, past medical history, past social history, past surgical history and problem list.   Objective:    There were no vitals filed for this visit.  Fetal Status:           General: Alert, oriented and cooperative. Patient is in no acute distress.  Skin: Skin is warm and dry. No rash noted.   Cardiovascular: Normal heart rate noted  Respiratory: Normal respiratory effort, no problems with respiration noted  Abdomen: Soft, gravid, appropriate for gestational age.        Pelvic: {Blank single:19197::Cervical exam performed in the presence of a chaperone,Cervical exam deferred}        Extremities: Normal range of motion.     Mental Status: Normal mood and affect. Normal behavior. Normal judgment and thought content.   Assessment and Plan:  Pregnancy: G4P3003 at [redacted]w[redacted]d 1. Supervision of high risk pregnancy, antepartum (Primary) Prenatal course reviewed BP, HR, FHR within normal limits Feeling regular FM   2. Late prenatal care  3. Language barrier affecting health care Due to language barrier, {in person, virtual:32750} interpreter was present during the history-taking, physical exam, and subsequent discussion with this patient.    4. Pregnancy with uncertain dates in third trimester  Term labor symptoms and general obstetric precautions including but not limited to vaginal bleeding, contractions,  leaking of fluid and fetal movement were reviewed in detail with the patient. Please refer to After Visit Summary for other counseling recommendations.   No follow-ups on file.  Future Appointments  Date Time Provider Department Center  02/04/2024  8:15 AM Wallace Joesph LABOR, [place] [facility]  02/11/2024  2:35 PM Jhonny Augustin BROCKS, MD [facility]  02/18/2024 10:55 AM Wallace Joesph LABOR, PA [facility]    Joesph LABOR Wallace, PA

## 2024-02-04 ENCOUNTER — Ambulatory Visit: Payer: Self-pay | Admitting: Family Medicine

## 2024-02-04 ENCOUNTER — Encounter: Payer: Self-pay | Admitting: Family Medicine

## 2024-02-04 ENCOUNTER — Other Ambulatory Visit: Payer: Self-pay

## 2024-02-04 VITALS — BP 111/73 | HR 76 | Wt 161.7 lb

## 2024-02-04 DIAGNOSIS — O0933 Supervision of pregnancy with insufficient antenatal care, third trimester: Secondary | ICD-10-CM

## 2024-02-04 DIAGNOSIS — O0930 Supervision of pregnancy with insufficient antenatal care, unspecified trimester: Secondary | ICD-10-CM

## 2024-02-04 DIAGNOSIS — Z3493 Encounter for supervision of normal pregnancy, unspecified, third trimester: Secondary | ICD-10-CM

## 2024-02-04 DIAGNOSIS — Z603 Acculturation difficulty: Secondary | ICD-10-CM

## 2024-02-04 DIAGNOSIS — Z758 Other problems related to medical facilities and other health care: Secondary | ICD-10-CM

## 2024-02-04 DIAGNOSIS — Z3A40 40 weeks gestation of pregnancy: Secondary | ICD-10-CM | POA: Diagnosis not present

## 2024-02-04 DIAGNOSIS — O0990 Supervision of high risk pregnancy, unspecified, unspecified trimester: Secondary | ICD-10-CM

## 2024-02-04 NOTE — Patient Instructions (Addendum)
 You have been scheduled for an induction on Sunday, July 13. You will receive a phone call that day to let you know when to come to the hospital.  Things to Try After 37 weeks to Encourage Labor/Get Ready for Labor:    Try the [employer] at [email or web address].[email or web address] daily to improve baby's position and encourage the onset of labor.  Walk a little and rest a little every day.  Change positions often.  Cervical Ripening: May try one or both Red Raspberry Leaf capsules or tea:  two 300mg  or 400mg  tablets with each meal, 2-3 times a day, or 1-3 cups of tea daily  Potential Side Effects Of Raspberry Leaf:  Most women do not experience any side effects from drinking raspberry leaf tea. However, nausea and loose stools are possible   Evening Primrose Oil capsules: take 1 capsule by mouth and place one capsule in the vagina every night.    Some of the potential side effects:  Upset stomach  Loose stools or diarrhea  Headaches  Nausea  Sex can also help the cervix ripen and encourage labor onset.

## 2024-02-08 ENCOUNTER — Telehealth (HOSPITAL_COMMUNITY): Payer: Self-pay | Admitting: *Deleted

## 2024-02-08 NOTE — Telephone Encounter (Signed)
 [phone] interpreter number

## 2024-02-10 ENCOUNTER — Inpatient Hospital Stay (HOSPITAL_COMMUNITY)

## 2024-02-11 ENCOUNTER — Encounter: Payer: Self-pay | Admitting: Family Medicine

## 2024-02-16 NOTE — Progress Notes (Signed)
 Spoke w/ PT @ [phone] to come in for induction. PT said she needed to talk to her husband about if she can come in.

## 2024-02-16 NOTE — Progress Notes (Signed)
 Called patient at 0730 am and left VM regarding coming in for induction of labor. No returned call received.

## 2024-02-16 NOTE — Progress Notes (Signed)
 Secretary called patient for induction at 2220 and husband stated that patient was asleep. I called again x2 and husband answered and I was able to speak to patient who stated that maybe she could come tomorrow.

## 2024-02-16 NOTE — Progress Notes (Signed)
 Patient returned call at 0119 stating that she could not make it to the hospital tonight and that maybe she could come tomorrow. Encouraged patient to come in tonight if able but says that she can maybe come tomorrow.

## 2024-02-17 ENCOUNTER — Other Ambulatory Visit: Payer: Self-pay

## 2024-02-17 ENCOUNTER — Inpatient Hospital Stay (HOSPITAL_COMMUNITY)
Admission: RE | Admit: 2024-02-17 | Discharge: 2024-02-20 | DRG: 807 | Disposition: A | Discharge status: Home / Self Care | Attending: Obstetrics and Gynecology | Admitting: Obstetrics and Gynecology

## 2024-02-17 ENCOUNTER — Encounter (HOSPITAL_COMMUNITY): Payer: Self-pay | Admitting: Family Medicine

## 2024-02-17 DIAGNOSIS — Z603 Acculturation difficulty: Secondary | ICD-10-CM | POA: Diagnosis present

## 2024-02-17 DIAGNOSIS — O0990 Supervision of high risk pregnancy, unspecified, unspecified trimester: Secondary | ICD-10-CM

## 2024-02-17 DIAGNOSIS — Z758 Other problems related to medical facilities and other health care: Secondary | ICD-10-CM | POA: Diagnosis present

## 2024-02-17 DIAGNOSIS — O0933 Supervision of pregnancy with insufficient antenatal care, third trimester: Secondary | ICD-10-CM | POA: Diagnosis not present

## 2024-02-17 DIAGNOSIS — O0930 Supervision of pregnancy with insufficient antenatal care, unspecified trimester: Secondary | ICD-10-CM

## 2024-02-17 DIAGNOSIS — Z3A42 42 weeks gestation of pregnancy: Secondary | ICD-10-CM | POA: Diagnosis not present

## 2024-02-17 DIAGNOSIS — O480 Post-term pregnancy: Principal | ICD-10-CM | POA: Diagnosis present

## 2024-02-17 LAB — CBC
HCT: 36.7 % (ref 36.0–46.0)
Hemoglobin: 13 g/dL (ref 12.0–15.0)
MCH: 32.7 pg (ref 26.0–34.0)
MCHC: 35.4 g/dL (ref 30.0–36.0)
MCV: 92.2 fL (ref 80.0–100.0)
Platelets: 204 K/uL (ref 150–400)
RBC: 3.98 MIL/uL (ref 3.87–5.11)
RDW: 13.1 % (ref 11.5–15.5)
WBC: 6.2 K/uL (ref 4.0–10.5)
nRBC: 0 % (ref 0.0–0.2)

## 2024-02-17 MED ORDER — OXYTOCIN-SODIUM CHLORIDE 30-0.9 UT/500ML-% IV SOLN
2.5000 [IU]/h | INTRAVENOUS | Status: DC
  Filled 2024-02-17: qty 500

## 2024-02-17 MED ORDER — SOD CITRATE-CITRIC ACID 500-334 MG/5ML PO SOLN: 30.0000 mL | ORAL | Status: DC | PRN

## 2024-02-17 MED ORDER — LACTATED RINGERS IV SOLN: INTRAVENOUS | Status: DC

## 2024-02-17 MED ORDER — ACETAMINOPHEN 325 MG PO TABS: 650.0000 mg | ORAL_TABLET | ORAL | Status: DC | PRN

## 2024-02-17 MED ORDER — TERBUTALINE SULFATE 1 MG/ML IJ SOLN: 0.2500 mg | Freq: Once | INTRAMUSCULAR | Status: DC | PRN

## 2024-02-17 MED ORDER — LIDOCAINE HCL (PF) 1 % IJ SOLN
30.0000 mL | INTRAMUSCULAR | Status: DC | PRN
  Filled 2024-02-17: qty 30

## 2024-02-17 MED ORDER — OXYTOCIN BOLUS FROM INFUSION
333.0000 mL | Freq: Once | INTRAVENOUS | Status: AC
  Administered 2024-02-18: 333 mL via INTRAVENOUS

## 2024-02-17 MED ORDER — LACTATED RINGERS IV SOLN
500.0000 mL | INTRAVENOUS | Status: DC | PRN
  Administered 2024-02-18: 500 mL via INTRAVENOUS

## 2024-02-17 MED ORDER — MISOPROSTOL 25 MCG QUARTER TABLET
25.0000 ug | ORAL_TABLET | Freq: Once | ORAL | Status: DC
  Filled 2024-02-17: qty 1

## 2024-02-17 MED ORDER — OXYCODONE-ACETAMINOPHEN 5-325 MG PO TABS: 2.0000 | ORAL_TABLET | ORAL | Status: DC | PRN

## 2024-02-17 MED ORDER — OXYCODONE-ACETAMINOPHEN 5-325 MG PO TABS: 1.0000 | ORAL_TABLET | ORAL | Status: DC | PRN

## 2024-02-17 MED ORDER — ONDANSETRON HCL 4 MG/2ML IJ SOLN: 4.0000 mg | Freq: Four times a day (QID) | INTRAMUSCULAR | Status: DC | PRN

## 2024-02-17 MED ORDER — FLEET ENEMA RE ENEM
1.0000 | ENEMA | RECTAL | Status: DC | PRN
Start: 2024-02-17 — End: 2024-02-18

## 2024-02-17 MED ORDER — MISOPROSTOL 50MCG HALF TABLET
50.0000 ug | ORAL_TABLET | Freq: Once | ORAL | Status: AC
  Administered 2024-02-17: 50 ug via ORAL
  Filled 2024-02-17: qty 1

## 2024-02-17 NOTE — H&P (Signed)
 OBSTETRIC ADMISSION HISTORY AND PHYSICAL  Shaniqwa Horsman is a 33 y.o. female [phone] with IUP at [redacted]w[redacted]d by 36w US  presenting for postdates IOL. She reports positive fetal movement. No LOF, VB, blurry vision, headaches, RUQ pain, or peripheral edema. She plans on breast and formula feeding. She is unsure of birth control method. She received her prenatal care at [facility].   Dating: By [redacted]w[redacted]d US  --->  Estimated Date of Delivery: 02/03/24  Sono:    @[redacted]w[redacted]d , EFW 50% normal anatomy, anterior placenta, cephalic presentation, 2844g, 49% EFW  NURSING  PROVIDER  [facility] Dating by 3rd trimester u/s  [facility] Model Traditional Anatomy U/S WNL  Initiated care at  [employer]  Swahili              LAB RESULTS   Support Person  undecided Genetics NIPS: LR AFP:     NT/IT (FT only)     Carrier Screen Horizon:   Rhogam  O/Positive/-- (04/30 1610) A1C/GTT Early HgbA1C:  Third trimester 2 hr GTT:   Flu Vaccine     TDaP Vaccine  12/03/2023 Blood Type O/Positive/-- (04/30 1610)O positive  RSV Vaccine  Antibody Negative (04/30 1610)Negative  COVID Vaccine  Rubella 5.42 (04/30 1610)5.42  Feeding Plan both RPR Non Reactive (05/23 0826)Nonreactive  Contraception undecided HBsAg Negative (04/30 1610)Negative  Circumcision Yes in patient HIV Non Reactive (05/23 0826)Nonreactive  P[facility]ian  [facility] HCVAb Non Reactive (04/30 1610)Nonreactive  Prenatal Classes     BTL Consent  Pap Diagnosis  Date Value Ref Range Status  05/19/2022   Final   - Negative for intraepithelial lesion or malignancy (NILM)    BTL Pre-payment  GC/CT Initial:   36wks:    VBAC Consent  GBS   For PCN allergy, check sensitivities   BRx Optimized? [ ]  yes   [ ]  no    DME Rx [ x] BP cuff [x]  Weight Scale Waterbirth  [ ]  Class [ ]  Consent [ ]  CNM visit  PHQ9 & GAD7 [  ] new OB [  ] 36 weeks  [  ] 36 weeks Induction  [ ]  Orders Entered [ ] Foley Y/N    Prenatal  History/Complications: none  Past Medical History: Past Medical History:  Diagnosis Date   Anemia    Anemia 01/10/2019   CBC Latest Ref Rng & Units 06/21/2020 01/11/2019 01/10/2019 WBC 3.4 - 10.8 x10E3/uL 4.4 9.6 6.9 Hemoglobin 11.1 - 15.9 g/dL 87.9 10.7(L) 11.8(L) Hematocrit 34.0 - 46.6 % 34.5 32.7(L) 35.6(L) Platelets 150 - 450 x10E3/uL 150 228 220     Past Surgical History: Past Surgical History:  Procedure Laterality Date   NO PAST SURGERIES      Obstetrical History: OB History     Gravida  4   Para  3   Term  3   Preterm  0   AB  0   Living  3      SAB  0   IAB  0   Ectopic  0   Multiple  0   Live Births  3           Social History Social History   Socioeconomic History   Marital status: Married    Spouse name: Innocent   Number of children: 2   Years of education: Not on file   Highest education level: Not on file  Occupational History  Not on file  Tobacco Use   Smoking status: Never   Smokeless tobacco: Never  Vaping Use   Vaping status: Never Used  Substance and Sexual Activity   Alcohol use: Never   Drug use: Never   Sexual activity: Yes    Partners: Male    Birth control/protection: None  Other Topics Concern   Not on file  Social History Narrative   Works at [employer]. Education: high Garment/textile technologist.    Social Drivers of Corporate investment banker Strain: Not on file  Food Insecurity: No Food Insecurity (02/17/2024)   Hunger Vital Sign    Worried About Running Out of Food in the Last Year: Never true    Ran Out of Food in the Last Year: Never true  Transportation Needs: No Transportation Needs (02/17/2024)   PRAPARE - Administrator, Civil Service (Medical): No    Lack of Transportation (Non-Medical): No  Physical Activity: Not on file  Stress: Not on file  Social Connections: Not on file    Family History: Family History  Problem Relation Age of Onset   Healthy Mother    Healthy Father      Allergies: No Known Allergies  Medications Prior to Admission  Medication Sig Dispense Refill Last Dose/Taking   Prenatal Vit-Fe Fumarate-FA (PRENATAL PLUS VITAMIN/MINERAL) 27-1 MG TABS Take 1 tablet by mouth daily. 30 tablet 11      Review of Systems   All systems reviewed and negative except as stated in HPI  Height 5' (1.524 m), weight 74.3 kg, last menstrual period 05/15/2023, currently breastfeeding. General appearance: alert, cooperative, and no distress Lungs: normal respiratory rate and effort Heart: regular rate  Abdomen: soft, non-tender, gravid Pelvic: SVE Dilation: 1 Effacement (%): Thick Station: -3 Presentation: Vertex Exam by:: Vernell, SNM  Extremities: no sign of DVT Presentation: cephalic  Fetal monitoring Baseline: 145 Variability: moderate Accelerations: 15x15 Decelerations: possible single decel seen following prolonged accel and prior to return to baseline, reviewed by attending (tracing otherwise reassuring)  Uterine activity: UI, no contractions felt by patient    Prenatal labs: ABO, Rh: O/Positive/-- (04/30 1610) Antibody: Negative (04/30 1610) Rubella: 5.42 (04/30 1610) RPR: Non Reactive (05/23 0826)  HBsAg: Negative (04/30 1610)  HIV: Non Reactive (05/23 0826)  GBS: Negative/-- (06/23 1134)    Lab Results  Component Value Date   GBS Negative 01/21/2024   GTT: 70, 112, 82 (12/21/23) Genetic screening: low risk NIPS, negative carrier screen (12/03/23) Anatomy US  normal at [redacted]w[redacted]d (01/07/24)  Immunization History  Administered Date(s) Administered   Tdap 07/19/2020, 12/03/2023    Prenatal Transfer Tool  Maternal Diabetes: No Genetic Screening: Normal Maternal Ultrasounds/Referrals: Normal Fetal Ultrasounds or other Referrals:  Referred to Materal Fetal Medicine  Maternal Substance Abuse:  No Significant Maternal Medications:  None Significant Maternal Lab Results: Group B Strep negative Number of Prenatal Visits:greater than 3  verified prenatal visits Maternal Vaccinations:TDap Other Comments:  None   No results found for this or any previous visit (from the past 24 hours).  Patient Active Problem List   Diagnosis Date Noted   Labor and delivery, indication for care 02/17/2024   Uncertain dates, antepartum 01/07/2024   Language barrier affecting health care 12/21/2023   Late prenatal care 11/28/2023   Supervision of high risk pregnancy, antepartum 11/28/2023    Assessment/Plan:  Rhena Glace is a 33 y.o. G4P3003 at [redacted]w[redacted]d here for IOL due to postdates pregnancy.  #Labor: Cervix thick and 1 cm dilated.  Discussed need for cervical ripening. After reviewing options, patient is agreeable to Cytotec  and Cooks catheter. Cooks catheter placed without difficulty and uterine balloon filled with 50cc, vaginal balloon not filled. Will redose Cytotec  q4 hours until balloon is expelled. #Pain: undecided about pain management #FWB:  Cat 1 #GBS status:  negative #Feeding: Breastmilk  and Formula #Reproductive Life planning: unsure if she desires birth control #Circ: female, not addressed at admission, will follow-up  Vernell FORBES Ruddle, Student-MidWife  02/17/2024, 10:28 PM

## 2024-02-17 NOTE — Progress Notes (Signed)
 PT called @ 1903 saying that she was coming in tonight.

## 2024-02-18 ENCOUNTER — Encounter: Payer: Self-pay | Admitting: Family Medicine

## 2024-02-18 ENCOUNTER — Encounter (HOSPITAL_COMMUNITY): Payer: Self-pay | Admitting: Obstetrics & Gynecology

## 2024-02-18 DIAGNOSIS — Z3A42 42 weeks gestation of pregnancy: Secondary | ICD-10-CM

## 2024-02-18 DIAGNOSIS — O0933 Supervision of pregnancy with insufficient antenatal care, third trimester: Secondary | ICD-10-CM

## 2024-02-18 DIAGNOSIS — O480 Post-term pregnancy: Secondary | ICD-10-CM

## 2024-02-18 LAB — RPR: RPR Ser Ql: NONREACTIVE

## 2024-02-18 MED ORDER — SODIUM CHLORIDE 0.9% FLUSH
3.0000 mL | Freq: Two times a day (BID) | INTRAVENOUS | Status: DC
  Administered 2024-02-18: 3 mL via INTRAVENOUS

## 2024-02-18 MED ORDER — SODIUM CHLORIDE 0.9 % IV SOLN
250.0000 mL | INTRAVENOUS | Status: DC | PRN
Start: 2024-02-18 — End: 2024-02-20

## 2024-02-18 MED ORDER — OXYCODONE HCL 5 MG PO TABS: 10.0000 mg | ORAL_TABLET | ORAL | Status: DC | PRN

## 2024-02-18 MED ORDER — ACETAMINOPHEN 325 MG PO TABS
650.0000 mg | ORAL_TABLET | ORAL | Status: DC | PRN
Start: 2024-02-18 — End: 2024-02-20
  Administered 2024-02-19: 650 mg via ORAL
  Filled 2024-02-18: qty 2

## 2024-02-18 MED ORDER — WITCH HAZEL-GLYCERIN EX PADS: 1.0000 | MEDICATED_PAD | CUTANEOUS | Status: DC | PRN

## 2024-02-18 MED ORDER — DIPHENHYDRAMINE HCL 25 MG PO CAPS: 25.0000 mg | ORAL_CAPSULE | Freq: Four times a day (QID) | ORAL | Status: DC | PRN

## 2024-02-18 MED ORDER — PRENATAL MULTIVITAMIN CH
1.0000 | ORAL_TABLET | Freq: Every day | ORAL | Status: DC
  Administered 2024-02-19 – 2024-02-20 (×2): 1 via ORAL
  Filled 2024-02-18 (×2): qty 1

## 2024-02-18 MED ORDER — ONDANSETRON HCL 4 MG PO TABS: 4.0000 mg | ORAL_TABLET | ORAL | Status: DC | PRN

## 2024-02-18 MED ORDER — SODIUM CHLORIDE 0.9% FLUSH
3.0000 mL | INTRAVENOUS | Status: DC | PRN
Start: 2024-02-18 — End: 2024-02-20

## 2024-02-18 MED ORDER — ONDANSETRON HCL 4 MG/2ML IJ SOLN: 4.0000 mg | INTRAMUSCULAR | Status: DC | PRN

## 2024-02-18 MED ORDER — OXYCODONE HCL 5 MG PO TABS: 5.0000 mg | ORAL_TABLET | ORAL | Status: DC | PRN

## 2024-02-18 MED ORDER — IBUPROFEN 800 MG PO TABS
800.0000 mg | ORAL_TABLET | Freq: Three times a day (TID) | ORAL | Status: DC
  Administered 2024-02-18 – 2024-02-20 (×5): 800 mg via ORAL
  Filled 2024-02-18 (×6): qty 1

## 2024-02-18 MED ORDER — DIBUCAINE (PERIANAL) 1 % EX OINT: 1.0000 | TOPICAL_OINTMENT | CUTANEOUS | Status: DC | PRN

## 2024-02-18 MED ORDER — COCONUT OIL OIL: 1.0000 | TOPICAL_OIL | Status: DC | PRN

## 2024-02-18 MED ORDER — SIMETHICONE 80 MG PO CHEW: 80.0000 mg | CHEWABLE_TABLET | ORAL | Status: DC | PRN

## 2024-02-18 MED ORDER — BENZOCAINE-MENTHOL 20-0.5 % EX AERO: 1.0000 | INHALATION_SPRAY | CUTANEOUS | Status: DC | PRN

## 2024-02-18 MED ORDER — SENNOSIDES-DOCUSATE SODIUM 8.6-50 MG PO TABS
2.0000 | ORAL_TABLET | ORAL | Status: DC
  Administered 2024-02-18 – 2024-02-20 (×2): 2 via ORAL
  Filled 2024-02-18 (×3): qty 2

## 2024-02-18 MED ORDER — OXYTOCIN-SODIUM CHLORIDE 30-0.9 UT/500ML-% IV SOLN
1.0000 m[IU]/min | INTRAVENOUS | Status: DC
  Administered 2024-02-18: 2 m[IU]/min via INTRAVENOUS

## 2024-02-18 NOTE — Progress Notes (Addendum)
 L&D Note  02/18/2024 - 9:08 AM  33 y.o. [id number] [redacted]w[redacted]d (dated by 35-36wk u/s). Pregnancy complicated by:  Patient Active Problem List   Diagnosis Date Noted   Labor and delivery, indication for care 02/17/2024   Uncertain dates, antepartum 01/07/2024   Language barrier affecting health care 12/21/2023   Late prenatal care 11/28/2023   Supervision of high risk pregnancy, antepartum 11/28/2023    [name] is admitted for IOL; pt had declined earlier IOL   Subjective:  Patient feels slight UCs but not uncomfortable  Objective:    Current Vital Signs 24h Vital Sign Ranges  T (!) 97.5 F (36.4 C) Temp  Avg: 97.5 F (36.4 C)  Min: 97.5 F (36.4 C)  Max: 97.5 F (36.4 C)  BP 110/65 BP  Min: 107/69  Max: 110/65  HR 85 Pulse  Avg: 77  Min: 69  Max: 85  RR 20 Resp  Avg: 20  Min: 20  Max: 20  SaO2     No data recorded       24 Hour I/O Current Shift I/O  Time Ins Outs No intake/output data recorded. No intake/output data recorded.   FHR: 150 baseline, no accel, recurrent lates, moderate variability Toco: q3-89m, MVUs 120s Gen: NAD SVE: deferred. AROM, scant clear fluid with IUPC placement but Dr. Von approximately 30 min ago  Labs:  Recent Labs  Lab 02/17/24 2237  WBC 6.2  HGB 13.0  HCT 36.7  PLT 204     Medications Current Facility-Administered Medications  Medication Dose Route Frequency Provider Last Rate Last Admin   acetaminophen  (TYLENOL ) tablet 650 mg  650 mg Oral Q4H PRN Stinson, Jacob J, DO       lactated ringers  infusion 500-1,000 mL  500-1,000 mL Intravenous PRN Stinson, Jacob J, DO 999 mL/hr at 02/18/24 0851 500 mL at 02/18/24 9148   lactated ringers  infusion   Intravenous Continuous Stinson, Jacob J, DO 125 mL/hr at 02/17/24 2233 New Bag at 02/17/24 2233   lidocaine  (PF) (XYLOCAINE ) 1 % injection 30 mL  30 mL Subcutaneous PRN Stinson, Jacob J, DO       misoprostol  (CYTOTEC ) tablet 25 mcg  25 mcg Vaginal Once Anyanwu, Ugonna A, MD        ondansetron  (ZOFRAN ) injection 4 mg  4 mg Intravenous Q6H PRN Stinson, Jacob J, DO       oxyCODONE -acetaminophen  (PERCOCET/ROXICET) 5-325 MG per tablet 1 tablet  1 tablet Oral Q4H PRN Stinson, Jacob J, DO       oxyCODONE -acetaminophen  (PERCOCET/ROXICET) 5-325 MG per tablet 2 tablet  2 tablet Oral Q4H PRN Stinson, Jacob J, DO       oxytocin  (PITOCIN ) IV BOLUS FROM BAG  333 mL Intravenous Once Stinson, Jacob J, DO       oxytocin  (PITOCIN ) IV infusion 30 units in NS 500 mL - Premix  2.5 Units/hr Intravenous Continuous Stinson, Jacob J, DO       sodium citrate-citric acid  (ORACIT) solution 30 mL  30 mL Oral Q2H PRN Stinson, Jacob J, DO       sodium phosphate (FLEET) enema 1 enema  1 enema Rectal PRN Stinson, Jacob J, DO       terbutaline  (BRETHINE ) injection 0.25 mg  0.25 mg Subcutaneous Once PRN Anyanwu, Ugonna A, MD        Assessment & Plan:  Patient stable *Pregnancy: I had Dr. Von recommend to patient AROM and IUPC placement to confirm uterine activity due to suspicion for recurrent late decels  when I came on and this is confirmed and has been ongoing since at least 0600. I d/w her that she is not in labor, we can not augment and I recommend a c-section due to the recurrent late decelerations. Patient current in RLR position with 1L bolus infusing and lates continue albeit not as deep. Risks associated with c-section as well as ongoing lates d/w her. Pt consider and I'll d/w her in a few minutes *GBS: neg  *Analgesia: doesn't have epidural. No current needs.   Interpreter declined  Sierra Thomas, Sierra Thomas. MD Attending Center for [employer] Midwife)

## 2024-02-18 NOTE — Discharge Summary (Signed)
 Postpartum Discharge Summary  Date of Service updated     Patient Name: Sierra Thomas DOB: April 08, 1991 MRN: [id number]  Date of admission: 02/17/2024 Delivery date:02/18/2024 Delivering provider: VON REASONER Date of discharge: 02/20/2024  Admitting diagnosis: Labor and delivery, indication for care [O75.9] Intrauterine pregnancy: [redacted]w[redacted]d     Secondary diagnosis:  Principal Problem:   SVD (spontaneous vaginal delivery) Active Problems:   Post-dates pregnancy   Late prenatal care   Supervision of high risk pregnancy, antepartum   Language barrier affecting health care   Labor and delivery, indication for care  Additional problems: None    Discharge diagnosis: Term Pregnancy Delivered                                              Post partum procedures:None Augmentation: AROM, Pitocin , Cytotec , and IP Foley Complications: None  Hospital course: Induction of Labor With Vaginal Delivery   33 y.o. yo H5E5995 at [redacted]w[redacted]d was admitted to the hospital 02/17/2024 for induction of labor.  Indication for induction: Postdates.  Patient had an labor course complicated by nothing. Membrane Rupture Time/Date: 8:33 AM,02/18/2024  Delivery Method:Vaginal, Spontaneous Operative Delivery:N/A Episiotomy: None Lacerations:  None Details of delivery can be found in separate delivery note.  Patient had a postpartum course complicated by none. Patient is discharged home 02/20/24.  Newborn Data: Birth date:02/18/2024 Birth time:5:45 PM Gender:Female Living status:Living Apgars:8 ,9  Weight:3540 g  Magnesium  Sulfate received: No BMZ received: No Rhophylac:N/A MMR:N/A T-DaP:Given prenatally Flu: No RSV Vaccine received: No Transfusion:No  Immunizations received: Immunization History  Administered Date(s) Administered   Tdap 07/19/2020, 12/03/2023    Physical exam  Vitals:   02/19/24 0832 02/19/24 1500 02/20/24 0210 02/20/24 0514  BP: 110/64 106/66 112/79 (!) 98/57  Pulse: 74 68  (!) 58 (!) 53  Resp: 18 18 18 18   Temp: 98.4 F (36.9 C) 98.4 F (36.9 C) 98.7 F (37.1 C)   TempSrc: Oral Oral    SpO2: 100%  99% 98%  Weight:      Height:       General: alert, cooperative, and no distress Lochia: appropriate Uterine Fundus: firm Incision: N/A DVT Evaluation: No evidence of DVT seen on physical exam.  Labs: Lab Results  Component Value Date   WBC 6.2 02/17/2024   HGB 13.0 02/17/2024   HCT 36.7 02/17/2024   MCV 92.2 02/17/2024   PLT 204 02/17/2024      Latest Ref Rng & Units 07/22/2022    4:09 AM  CMP  Glucose 70 - 99 mg/dL 93   BUN 6 - 20 mg/dL 6   Creatinine 9.55 - 8.99 mg/dL 9.46   Sodium 864 - 854 mmol/L 139   Potassium 3.5 - 5.1 mmol/L 4.3   Chloride 98 - 111 mmol/L 111   CO2 22 - 32 mmol/L 22   Calcium 8.9 - 10.3 mg/dL 8.8    Edinburgh Score:    02/19/2024    4:00 PM  Edinburgh Postnatal Depression Scale Screening Tool  I have been able to laugh and see the funny side of things. 0  I have looked forward with enjoyment to things. 0  I have blamed myself unnecessarily when things went wrong. 1  I have been anxious or worried for no good reason. 0  I have felt scared or panicky for no good reason. 0  Things have  been getting on top of me. 0  I have been so unhappy that I have had difficulty sleeping. 0  I have felt sad or miserable. 1  I have been so unhappy that I have been crying. 0  The thought of harming myself has occurred to me. 0  Edinburgh Postnatal Depression Scale Total 2   Edinburgh Postnatal Depression Scale Total: 2   After visit meds:  Allergies as of 02/20/2024   No Known Allergies      Medication List     TAKE these medications    acetaminophen  500 MG tablet Commonly known as: TYLENOL  Take 2 tablets (1,000 mg total) by mouth every 8 (eight) hours as needed for mild pain (pain score 1-3) or moderate pain (pain score 4-6).   ibuprofen  800 MG tablet Commonly known as: ADVIL  Take 1 tablet (800 mg total) by  mouth every 8 (eight) hours as needed for mild pain (pain score 1-3) or moderate pain (pain score 4-6).   Prenatal Plus Vitamin/Mineral 27-1 MG Tabs Take 1 tablet by mouth daily.   senna 8.6 MG Tabs tablet Commonly known as: SENOKOT Take 2 tablets (17.2 mg total) by mouth at bedtime as needed for mild constipation.         Discharge home in stable condition Infant Feeding: Breast Infant Disposition:home with mother Discharge instruction: per After Visit Summary and Postpartum booklet. Activity: Advance as tolerated. Pelvic rest for 6 weeks.  Diet: routine diet Future Appointments:No future appointments. Follow up Visit: Message sent to [facility] 7/21  Please schedule this patient for a In person postpartum visit in 6 weeks with the following provider: Any provider. Additional Postpartum F/U:N/A  Low risk pregnancy complicated by: N/A Delivery mode:  Vaginal, Spontaneous Anticipated Birth Control:  Unsure   02/20/2024 Almarie CHRISTELLA Moats, MD

## 2024-02-18 NOTE — Progress Notes (Addendum)
 Patient ID: Sierra Thomas, female   DOB: 1990-12-14, 33 y.o.   MRN: [id number]  Vitals:   02/18/24 0057 02/18/24 0449  BP: 107/69 110/65  Pulse: 69 85  Resp:  20  Temp:  (!) 97.5 F (36.4 C)   SNM to bedside at approx 0330. Balloon remains in place. Patient reports painful UC q 5 minutes with painful cramping between contractions; correlates with TOCO. Will hold next Cytotec  dose as long as UC pattern continues. Plan for recheck when balloon is expelled.   Fetal tracing review at 0512 finds single prolonged decel around 0430 with return to baseline and moderate variability seen throughout. FHR now Cat 1.   Vernell Ruddle, [facility] 02/18/24 [phone]

## 2024-02-18 NOTE — Progress Notes (Addendum)
 L&D Note 145 baseline, no accel, recurrent decels to the 130s, mod variability Q3-78m UCs, MVUs 90-100 I went to d/w patient again and she declines c-section. Risk of fetal distress associated with recurrent late decels and that I, again, recommend proceeding with c-section, but she, again, declines c-section. I told her we'll continue with close observation and continually reassess her, especially if decels worsen.   Patient again declines interpreter  Bebe Izell Raddle MD Attending Center for [employer] (Faculty Practice) 02/18/2024 Time: [phone]

## 2024-02-18 NOTE — Progress Notes (Signed)
 L&D Note  02/18/2024 - 3:28 PM  33 y.o. H5E6996 [redacted]w[redacted]d (dated by 35-36wk u/s). Pregnancy complicated by:  Patient Active Problem List   Diagnosis Date Noted   Labor and delivery, indication for care 02/17/2024   Uncertain dates, antepartum 01/07/2024   Language barrier affecting health care 12/21/2023   Late prenatal care 11/28/2023   Supervision of high risk pregnancy, antepartum 11/28/2023   Post-dates pregnancy 09/24/2020    [name] is admitted for IOL; pt had declined earlier IOL   Subjective:  Feeling more intense contractions  Objective:    Current Vital Signs 24h Vital Sign Ranges  T 98.4 F (36.9 C) Temp  Avg: 98 F (36.7 C)  Min: 97.5 F (36.4 C)  Max: 98.4 F (36.9 C)  BP 120/80 BP  Min: 107/69  Max: 120/80  HR 90 Pulse  Avg: 76.6  Min: 61  Max: 90  RR 18 Resp  Avg: 17.5  Min: 16  Max: 20  SaO2     No data recorded       24 Hour I/O Current Shift I/O  Time Ins Outs No intake/output data recorded. No intake/output data recorded.   FHR: 145 baseline, no accel, early decels, moderate variability Toco: q2-35m 250-300 MVU Gen: moderate distress with UCs, patient still in RLL SVE: deferred, 6-7/80/-2 at 1430 by RN and FSE placed  Labs:  Recent Labs  Lab 02/17/24 2237  WBC 6.2  HGB 13.0  HCT 36.7  PLT 204    Medications Current Facility-Administered Medications  Medication Dose Route Frequency Provider Last Rate Last Admin   acetaminophen  (TYLENOL ) tablet 650 mg  650 mg Oral Q4H PRN Stinson, Jacob J, DO       lactated ringers  infusion 500-1,000 mL  500-1,000 mL Intravenous PRN Stinson, Jacob J, DO 999 mL/hr at 02/18/24 0851 500 mL at 02/18/24 9148   lactated ringers  infusion   Intravenous Continuous Stinson, Jacob J, DO 125 mL/hr at 02/18/24 1105 New Bag at 02/18/24 1105   lidocaine  (PF) (XYLOCAINE ) 1 % injection 30 mL  30 mL Subcutaneous PRN Stinson, Jacob J, DO       misoprostol  (CYTOTEC ) tablet 25 mcg  25 mcg Vaginal Once Anyanwu,  Ugonna A, MD       ondansetron  (ZOFRAN ) injection 4 mg  4 mg Intravenous Q6H PRN Stinson, Jacob J, DO       oxyCODONE -acetaminophen  (PERCOCET/ROXICET) 5-325 MG per tablet 1 tablet  1 tablet Oral Q4H PRN Stinson, Jacob J, DO       oxyCODONE -acetaminophen  (PERCOCET/ROXICET) 5-325 MG per tablet 2 tablet  2 tablet Oral Q4H PRN Stinson, Jacob J, DO       oxytocin  (PITOCIN ) IV BOLUS FROM BAG  333 mL Intravenous Once Stinson, Jacob J, DO       oxytocin  (PITOCIN ) IV infusion 30 units in NS 500 mL - Premix  2.5 Units/hr Intravenous Continuous Stinson, Jacob J, DO       oxytocin  (PITOCIN ) IV infusion 30 units in NS 500 mL - Premix  1-40 milli-units/min Intravenous Titrated Sierra Harari, MD 6 mL/hr at 02/18/24 1210 6 milli-units/min at 02/18/24 1210   sodium citrate-citric acid  (ORACIT) solution 30 mL  30 mL Oral Q2H PRN Stinson, Jacob J, DO       sodium phosphate (FLEET) enema 1 enema  1 enema Rectal PRN Stinson, Jacob J, DO       terbutaline  (BRETHINE ) injection 0.25 mg  0.25 mg Subcutaneous Once PRN Anyanwu, Ugonna A, MD  Assessment & Plan:  *Pregnancy: continue with pitocin  augmentation. Will hold at current dose of 6 and may need to come down from if contractions get more frequent and/or higher MVUs. Anticipate SVE *GBS: neg  *Analgesia: continuing for non medicated delivery  Bebe Sierra Overcast MD Attending Center for [facility])

## 2024-02-18 NOTE — Progress Notes (Addendum)
 Labor Progress Note  Kassia Demarinis is a 34 y.o. [phone] at [redacted]w[redacted]d presented for IOL for postdates  S: Resting, not feeling uncomfortable  O:  BP 110/65   Pulse 85   Temp (!) 97.5 F (36.4 C) (Oral)   Resp 20   Ht 5' (1.524 m)   Wt 74.3 kg   LMP 05/15/2023   BMI 31.99 kg/m  EFM:140sbpm/Moderate variability/ 10x10 accels, no significant accels/ likely Late decels CAT: 2 Toco: difficult to trace  CVE: Dilation: 3.5 Effacement (%): 50 Cervical Position: Posterior Station: -2 Presentation: Vertex Exam by:: Dr. Deedee   A&P: 33 y.o. H5E6996 [redacted]w[redacted]d  here for IOL as above  #Labor: No change on check. Some decelerations, likely late but difficult to trace contractions. Discussed risks v benefits AROM, IUPC placement, and FSE placement. Questions answered and patient agreeable. AROM to clear fluid. IUPC placed without difficulty. Patient unable to tolerate FSE placement. Able to trace baby with external monitor well. Will consider placing at next check. #Pain: Family/Friend support, epidural if requested #FWB: CAT 2 #GBS negative  Hobert JAYSON Deedee, MD Family Medicine PGY-3 02/18/24  8:48 AM

## 2024-02-18 NOTE — Progress Notes (Signed)
 L&D Note  02/18/2024 - 11:07 AM  33 y.o. H5E6996 [redacted]w[redacted]d (dated by 35-36wk u/s). Pregnancy complicated by:  Patient Active Problem List   Diagnosis Date Noted   Labor and delivery, indication for care 02/17/2024   Uncertain dates, antepartum 01/07/2024   Language barrier affecting health care 12/21/2023   Late prenatal care 11/28/2023   Supervision of high risk pregnancy, antepartum 11/28/2023   Post-dates pregnancy 09/24/2020    [name] is admitted for IOL; pt had declined earlier IOL   Subjective:  Patient feels slight UCs but not uncomfortable  Objective:    Current Vital Signs 24h Vital Sign Ranges  T 98 F (36.7 C) Temp  Avg: 97.8 F (36.6 C)  Min: 97.5 F (36.4 C)  Max: 98 F (36.7 C)  BP 108/72 BP  Min: 107/69  Max: 110/65  HR 78 Pulse  Avg: 77.3  Min: 69  Max: 85  RR 16 Resp  Avg: 18  Min: 16  Max: 20  SaO2     No data recorded       24 Hour I/O Current Shift I/O  Time Ins Outs No intake/output data recorded. No intake/output data recorded.   FHR: 145 baseline, rare accels, recurrent lates, moderate variability Toco: q106m with some couplets, MVUs 110s Gen: NAD, patient still in RLL SVE: unchnaged at 3-4/long/high/cephalic, clear fluid  Labs:  Recent Labs  Lab 02/17/24 2237  WBC 6.2  HGB 13.0  HCT 36.7  PLT 204    Medications Current Facility-Administered Medications  Medication Dose Route Frequency Provider Last Rate Last Admin   acetaminophen  (TYLENOL ) tablet 650 mg  650 mg Oral Q4H PRN Stinson, Jacob J, DO       lactated ringers  infusion 500-1,000 mL  500-1,000 mL Intravenous PRN Stinson, Jacob J, DO 999 mL/hr at 02/18/24 0851 500 mL at 02/18/24 9148   lactated ringers  infusion   Intravenous Continuous Stinson, Jacob J, DO 125 mL/hr at 02/18/24 1105 New Bag at 02/18/24 1105   lidocaine  (PF) (XYLOCAINE ) 1 % injection 30 mL  30 mL Subcutaneous PRN Stinson, Jacob J, DO       misoprostol  (CYTOTEC ) tablet 25 mcg  25 mcg Vaginal Once  Anyanwu, Ugonna A, MD       ondansetron  (ZOFRAN ) injection 4 mg  4 mg Intravenous Q6H PRN Stinson, Jacob J, DO       oxyCODONE -acetaminophen  (PERCOCET/ROXICET) 5-325 MG per tablet 1 tablet  1 tablet Oral Q4H PRN Stinson, Jacob J, DO       oxyCODONE -acetaminophen  (PERCOCET/ROXICET) 5-325 MG per tablet 2 tablet  2 tablet Oral Q4H PRN Stinson, Jacob J, DO       oxytocin  (PITOCIN ) IV BOLUS FROM BAG  333 mL Intravenous Once Stinson, Jacob J, DO       oxytocin  (PITOCIN ) IV infusion 30 units in NS 500 mL - Premix  2.5 Units/hr Intravenous Continuous Stinson, Jacob J, DO       oxytocin  (PITOCIN ) IV infusion 30 units in NS 500 mL - Premix  1-40 milli-units/min Intravenous Titrated Izell Harari, MD       sodium citrate-citric acid  (ORACIT) solution 30 mL  30 mL Oral Q2H PRN Stinson, Jacob J, DO       sodium phosphate (FLEET) enema 1 enema  1 enema Rectal PRN Stinson, Jacob J, DO       terbutaline  (BRETHINE ) injection 0.25 mg  0.25 mg Subcutaneous Once PRN Anyanwu, Ugonna A, MD        Assessment & Plan:  *  Pregnancy: patient still declining c-section. With improvement in decelerations. Given this, I told her that since cervix is unchanged then only other option would be to try pitocin  with risk of fetal intolerance. She is amenable to pitocin   *GBS: neg  *Analgesia: doesn't have epidural. No current needs.   Interpreter declined  Bebe Furry, Mickey. MD Attending Center for [employer] [facility])

## 2024-02-19 NOTE — Progress Notes (Signed)
 Post Partum Day 1 Subjective: Doing well. No acute events overnight. Pain is controlled and bleeding is appropriate. She is eating, drinking, voiding, and ambulating without issue. She is breat/bottle feeding which is going well. She has no other concerns at this time and family wishes to consider discharge later today.  Objective: Blood pressure 110/64, pulse 74, temperature 98.4 F (36.9 C), temperature source Oral, resp. rate 18, height 5' (1.524 m), weight 74.3 kg, last menstrual period 05/15/2023, SpO2 100%, unknown if currently breastfeeding.  Physical Exam:  General: alert, cooperative, and no distress CV: RRR Lungs: CTAB, normal respiratory effort Lochia: appropriate Uterine Fundus: firm, below umbilicus Incision: NA DVT Evaluation: No evidence of DVT seen on physical exam.  Recent Labs    02/17/24 2237  HGB 13.0  HCT 36.7    Assessment/Plan: Sierra Thomas is a 33 y.o. H5E5995 on PPD# 1 s/p NSVD.  Progressing well. Meeting postpartum milestones. VSS. Continue routine postpartum care.  Feeding: both Contraception: not sure Circumcision: circumcision consent obtained  Dispo:  Possible discharge home today, pending baby status   LOS: 2 days   Gordy Goar M Adiya Selmer, DO  02/19/2024, 11:27 AM

## 2024-02-19 NOTE — Lactation Note (Signed)
 This note was copied from a baby's chart. Lactation Consultation Note  Patient Name: Sierra Thomas Unijb'd Date: 02/19/2024 Age:34 hours Reason for consult: Follow-up assessment;Term  P4, 42 wks, @ 24 hrs of life. Mom receptive to infant coming to breast, baby showing feeding cues. Mom latches baby easily, encouraged her to let baby learn his way through the steps. Discussed expectations @ breast- Day 1- sleepy/ feed every 3 hrs/ even 10 minutes is okay, Day 2 more awake/ feeding cues/longer feeds, and cluster feeding overnights brings milk in. Highlighted breast stimulation is tied directly to milk production. Discussed hands on breast and baby, keeping baby awake @ breast. Starting with hand expression & breast compression to get baby working @ breast, and gentle stimulation to keep baby working @ breast. Encouraged EBM for nipple care post feed. LC services and milk storage shared. Encouraged mom to call for assist anytime desired.  Maternal Data Has patient been taught Hand Expression?: Yes Does the patient have breastfeeding experience prior to this delivery?: Yes How long did the patient breastfeed?: 1 1/2 yr each  Feeding Mother's Current Feeding Choice: Breast Milk and Formula Nipple Type: Slow - flow  LATCH Score Latch: Grasps breast easily, tongue down, lips flanged, rhythmical sucking.  Audible Swallowing: Spontaneous and intermittent  Type of Nipple: Everted at rest and after stimulation  Comfort (Breast/Nipple): Soft / non-tender  Hold (Positioning): Assistance needed to correctly position infant at breast and maintain latch.  LATCH Score: 9   Lactation Tools Discussed/Used Tools: Pump;Flanges;Coconut oil Flange Size: 21 Breast pump type: Manual Pump Education: Milk Storage;Setup, frequency, and cleaning  Interventions Interventions: Breast feeding basics reviewed;Assisted with latch;Hand express;Breast compression;Expressed milk;Hand pump;Education;LC  Services brochure;CDC milk storage guidelines  Discharge Pump: Manual;Personal  Consult Status Consult Status: Follow-up Date: 02/20/24 Follow-up type: In-patient    [facility] 02/19/2024, 6:36 PM

## 2024-02-19 NOTE — Lactation Note (Signed)
 This note was copied from a baby's chart. Lactation Consultation Note  Patient Name: Sierra Thomas Date: 02/19/2024 Age:35 hours Reason for consult: Initial assessment;Term Mom resting, awake but very tired. Mom stated the baby had all ready eaten. Mom puts baby to the breast some but she has no milk. Mom is BF/formula feeding until her milk comes in. Encouraged to BF first then formula feed. Mom stated OK. Reviewed mature milk 3-5 days, colostrum important. Asked mom to call for Lactation to see latch and when she is more awake. Mom stated OK.   Maternal Data Has patient been taught Hand Expression?: No Does the patient have breastfeeding experience prior to this delivery?: Yes How long did the patient breastfeed?: 1 1/2 yr each  Feeding Nipple Type: Slow - flow  LATCH Score                    Lactation Tools Discussed/Used    Interventions Interventions: LC Services brochure;CDC milk storage guidelines  Discharge    Consult Status Consult Status: Follow-up Date: 02/19/24 Follow-up type: In-patient    Josiel Gahm G 02/19/2024, 5:09 AM

## 2024-02-20 MED ORDER — SENNA 8.6 MG PO TABS: 2.0000 | ORAL_TABLET | Freq: Every evening | ORAL | 0 refills | Status: AC | PRN

## 2024-02-20 MED ORDER — ACETAMINOPHEN 500 MG PO TABS: 1000.0000 mg | ORAL_TABLET | Freq: Three times a day (TID) | ORAL | 0 refills | Status: AC | PRN

## 2024-02-20 MED ORDER — IBUPROFEN 800 MG PO TABS: 800.0000 mg | ORAL_TABLET | Freq: Three times a day (TID) | ORAL | 0 refills | Status: AC | PRN

## 2024-02-20 NOTE — Lactation Note (Signed)
 This note was copied from a baby's chart. Lactation Consultation Note  Patient Name: Boy Floretta Petro Unijb'd Date: 02/20/2024 Age:34 hours Reason for consult: Follow-up assessment;Term;Maternal discharge  P4, 42 wks, @ 41 hrs of life. Mom has no concern with breast feeding- discharge anticipated today.  Encouraged mom to keep working on big mouth latch with baby and use EBM or coconut oil after each feed. Discussed cluster feeding overnight/ early morning brings in our milk supply, shared expectations of milk coming in. Highlighted risk of engorgement. Discussed hand pump/express to soften breasts, motrin  as anti-inflammatory, and ice packs for 10-20 minutes post feed/pumping if still over-full is the best treatments for inflamed/engorged breasts. Hand pump going home with mom.  Feeding Mother's Current Feeding Choice: Breast Milk and Formula   Interventions Interventions: Breast feeding basics reviewed;Hand express;Breast compression;Expressed milk;Coconut oil;Hand pump;Education;LC Services brochure;CDC milk storage guidelines  Discharge Discharge Education: Engorgement and breast care Pump: Manual;Personal  Consult Status Consult Status: Complete Date: 02/20/24 Follow-up type: In-patient    [facility] 02/20/2024, 11:41 AM

## 2024-02-20 NOTE — Patient Instructions (Signed)
 Your appointment with Outpatient Lactation is: Date: August 12 th  Time:11:30 am [facility] (First Floor) [address] [place]  Check in under baby's name.  Please bring your baby hungry along with your pump and a bottle of either formula or expressed breast milk. Please also bring your pump flanges and we welcome support people! If you need lactation assistance before your appointment, please call [phone] and press 4 for lactation.

## 2024-02-21 LAB — TYPE AND SCREEN
ABO/RH(D): O POS
Antibody Screen: POSITIVE
Donor AG Type: NEGATIVE
Donor AG Type: NEGATIVE
Unit division: 0
Unit division: 0

## 2024-02-21 LAB — BPAM RBC
Blood Product Expiration Date: 202508112359
Blood Product Expiration Date: 202508222359
Unit Type and Rh: 5100
Unit Type and Rh: 5100

## 2024-03-06 ENCOUNTER — Telehealth (HOSPITAL_COMMUNITY): Payer: Self-pay | Admitting: *Deleted

## 2024-03-06 NOTE — Telephone Encounter (Signed)
 03/06/2024  Name: Ceria Suminski MRN: [id number] DOB: 1990/09/17  Reason for Call:  Transition of Care Hospital Discharge Call  Contact Status: Patient Contact Status: Message (person who answered phone said that patient was on another call, will try again later this afternoon)  Language assistant needed:          Follow-Up Questions:    Van Postnatal Depression Scale:  In the Past 7 Days:    PHQ2-9 Depression Scale:     Discharge Follow-up:    Post-discharge interventions: NA  Mliss Sieve, RN 03/06/2024 13:21

## 2024-03-06 NOTE — Telephone Encounter (Signed)
 03/06/2024  Name: Sierra Thomas MRN: [id number] DOB: [birth year]  Reason for Call:  Transition of Care Hospital Discharge Call  Contact Status: Patient Contact Status: Complete  Language assistant needed: Interpreter Mode: Interpreter Not Needed        Follow-Up Questions: Do You Have Any Concerns About Your Health As You Heal From Delivery?: No Do You Have Any Concerns About Your Infants Health?: No  Edinburgh Postnatal Depression Scale:  In the Past 7 Days: I have been able to laugh and see the funny side of things.: As much as I always could I have looked forward with enjoyment to things.: As much as I ever did I have blamed myself unnecessarily when things went wrong.: No, never I have been anxious or worried for no good reason.: No, not at all I have felt scared or panicky for no good reason.: No, not at all Things have been getting on top of me.: No, I have been coping as well as ever I have been so unhappy that I have had difficulty sleeping.: Not at all I have felt sad or miserable.: No, not at all I have been so unhappy that I have been crying.: No, never The thought of harming myself has occurred to me.: Never Van Postnatal Depression Scale Total: 0  PHQ2-9 Depression Scale:     Discharge Follow-up: Edinburgh score requires follow up?: No Patient was advised of the following resources:: Support Group, Breastfeeding Support Group  Post-discharge interventions: Reviewed Newborn Safe Sleep Practices  Mliss Sieve, RN 03/06/2024 14:40

## 2024-03-27 ENCOUNTER — Ambulatory Visit: Admitting: Obstetrics and Gynecology

## 2024-05-21 ENCOUNTER — Ambulatory Visit (HOSPITAL_COMMUNITY)
Admission: EM | Admit: 2024-05-21 | Discharge: 2024-05-21 | Disposition: A | Discharge status: Home / Self Care | Attending: Family Medicine | Admitting: Family Medicine

## 2024-05-21 ENCOUNTER — Encounter (HOSPITAL_COMMUNITY): Payer: Self-pay

## 2024-05-21 DIAGNOSIS — J00 Acute nasopharyngitis [common cold]: Secondary | ICD-10-CM | POA: Diagnosis not present

## 2024-05-21 DIAGNOSIS — R051 Acute cough: Secondary | ICD-10-CM

## 2024-05-21 LAB — POC COVID19/FLU A&B COMBO
Covid Antigen, POC: NEGATIVE
Influenza A Antigen, POC: NEGATIVE
Influenza B Antigen, POC: NEGATIVE

## 2024-05-21 MED ORDER — BENZONATATE 100 MG PO CAPS: ORAL_CAPSULE | ORAL | 0 refills | Status: AC

## 2024-05-21 NOTE — ED Provider Notes (Signed)
 [facility]   [id number] 05/21/24 Arrival Time: 1228  ASSESSMENT & PLAN:  1. Common cold   2. Acute cough    Discussed typical duration of likely viral illness. Results for orders placed or performed during the hospital encounter of 05/21/24  POC Covid19/Flu A&B Antigen   Collection Time: 05/21/24  2:20 PM  Result Value Ref Range   Influenza A Antigen, POC Negative Negative   Influenza B Antigen, POC Negative Negative   Covid Antigen, POC Negative Negative   OTC symptom care as needed.  Meds ordered this encounter  Medications   benzonatate (TESSALON) 100 MG capsule    Sig: Take 1 capsule by mouth every 8 (eight) hours for cough.    Dispense:  21 capsule    Refill:  0     Follow-up Information     [facility].   Specialty: Urgent Care Why: As needed. Contact information: [address] [place]  [address] [phone]                Reviewed expectations re: course of current medical issues. Questions answered. Outlined signs and symptoms indicating need for more acute intervention. Understanding verbalized. After Visit Summary given.   SUBJECTIVE: History from: Patient. Sierra Thomas is a 33 y.o. female. Pt c/o cough and headache since yesterday. Denies taken any meds. Denies: fever and difficulty breathing. Normal PO intake without n/v/d.  OBJECTIVE:  Vitals:   05/21/24 1338  BP: 110/72  Pulse: 72  Resp: 18  Temp: 98.1 F (36.7 C)  TempSrc: Oral  SpO2: 98%    General appearance: alert; no distress Eyes: PERRLA; EOMI; conjunctiva normal HENT: [place]; AT; with nasal congestion Neck: supple  Lungs: speaks full sentences without difficulty; unlabored Extremities: no edema Skin: warm and dry Neurologic: normal gait Psychological: alert and cooperative; normal mood and affect  Labs: Results for orders placed or performed during the hospital encounter of 05/21/24  POC Covid19/Flu  A&B Antigen   Collection Time: 05/21/24  2:20 PM  Result Value Ref Range   Influenza A Antigen, POC Negative Negative   Influenza B Antigen, POC Negative Negative   Covid Antigen, POC Negative Negative   Labs Reviewed  POC COVID19/FLU A&B COMBO    Imaging: No results found.  No Known Allergies  Past Medical History:  Diagnosis Date   Anemia    Anemia 01/10/2019   CBC Latest Ref Rng & Units 06/21/2020 01/11/2019 01/10/2019 WBC 3.4 - 10.8 x10E3/uL 4.4 9.6 6.9 Hemoglobin 11.1 - 15.9 g/dL 87.9 10.7(L) 11.8(L) Hematocrit 34.0 - 46.6 % 34.5 32.7(L) 35.6(L) Platelets 150 - 450 x10E3/uL 150 228 220    Social History   Socioeconomic History   Marital status: Married    Spouse name: Innocent   Number of children: 2   Years of education: Not on file   Highest education level: Not on file  Occupational History   Not on file  Tobacco Use   Smoking status: Never   Smokeless tobacco: Never  Vaping Use   Vaping status: Never Used  Substance and Sexual Activity   Alcohol use: Never   Drug use: Never   Sexual activity: Yes    Partners: Male    Birth control/protection: None  Other Topics Concern   Not on file  Social History Narrative   Works at [employer]. Education: high Garment/textile technologist.    Social Drivers of Corporate investment banker Strain: Not on file  Food Insecurity: No  Food Insecurity (02/17/2024)   Hunger Vital Sign    Worried About Running Out of Food in the Last Year: Never true    Ran Out of Food in the Last Year: Never true  Transportation Needs: No Transportation Needs (02/17/2024)   PRAPARE - Administrator, Civil Service (Medical): No    Lack of Transportation (Non-Medical): No  Physical Activity: Not on file  Stress: Not on file  Social Connections: Not on file  Intimate Partner Violence: Not At Risk (02/17/2024)   Humiliation, Afraid, Rape, and Kick questionnaire    Fear of Current or Ex-Partner: No    Emotionally Abused: No    Physically  Abused: No    Sexually Abused: No   Family History  Problem Relation Age of Onset   Healthy Mother    Healthy Father    Past Surgical History:  Procedure Laterality Date   NO PAST SURGERIES       Rolinda Rogue, MD 05/21/24 1502

## 2024-05-21 NOTE — ED Triage Notes (Signed)
 Pt c/o cough and headache since yesterday. Denies taken any meds.

## 2024-07-13 ENCOUNTER — Ambulatory Visit (HOSPITAL_COMMUNITY)
Admission: EM | Admit: 2024-07-13 | Discharge: 2024-07-13 | Disposition: A | Discharge status: Home / Self Care | Attending: Internal Medicine | Admitting: Internal Medicine

## 2024-07-13 ENCOUNTER — Encounter (HOSPITAL_COMMUNITY): Payer: Self-pay | Admitting: Emergency Medicine

## 2024-07-13 DIAGNOSIS — J029 Acute pharyngitis, unspecified: Secondary | ICD-10-CM | POA: Diagnosis not present

## 2024-07-13 DIAGNOSIS — J0100 Acute maxillary sinusitis, unspecified: Secondary | ICD-10-CM

## 2024-07-13 LAB — POCT RAPID STREP A (OFFICE): Rapid Strep A Screen: NEGATIVE

## 2024-07-13 MED ORDER — AMOXICILLIN 875 MG PO TABS: 875.0000 mg | ORAL_TABLET | Freq: Two times a day (BID) | ORAL | 0 refills | Status: AC

## 2024-07-13 NOTE — ED Provider Notes (Signed)
 [facility]    CSN: [id number] Arrival date & time: 07/13/24  1509      History   Chief Complaint Chief Complaint  Patient presents with   Sore Throat   Nasal Congestion    HPI Elspeth Blucher is a 33 y.o. female.   33 year old female who presents urgent care with complaints of nasal congestion, sore throat and headache.  The symptoms started about a week ago.  She has not really taken much over-the-counter for this.  Her symptoms have gotten worse.  She reports that her 50-year-old is also sick.  She denies any nausea, vomiting, fevers, chills.  She is able to eat and drink.   Sore Throat Pertinent negatives include no chest pain, no abdominal pain and no shortness of breath.    Past Medical History:  Diagnosis Date   Anemia    Anemia 01/10/2019   CBC Latest Ref Rng & Units 06/21/2020 01/11/2019 01/10/2019 WBC 3.4 - 10.8 x10E3/uL 4.4 9.6 6.9 Hemoglobin 11.1 - 15.9 g/dL 87.9 10.7(L) 11.8(L) Hematocrit 34.0 - 46.6 % 34.5 32.7(L) 35.6(L) Platelets 150 - 450 x10E3/uL 150 228 220     Patient Active Problem List   Diagnosis Date Noted   Language barrier affecting health care 12/21/2023   Late prenatal care 11/28/2023   Supervision of high risk pregnancy, antepartum 11/28/2023    Past Surgical History:  Procedure Laterality Date   NO PAST SURGERIES      OB History     Gravida  4   Para  4   Term  4   Preterm  0   AB  0   Living  4      SAB  0   IAB  0   Ectopic  0   Multiple  0   Live Births  4            Home Medications    Prior to Admission medications  Medication Sig Start Date End Date Taking? Authorizing Provider  amoxicillin  (AMOXIL ) 875 MG tablet Take 1 tablet (875 mg total) by mouth 2 (two) times daily for 7 days. 07/13/24 07/20/24 Yes Justine Dines A, PA-C  acetaminophen  (TYLENOL ) 500 MG tablet Take 2 tablets (1,000 mg total) by mouth every 8 (eight) hours as needed for mild pain (pain score 1-3) or moderate pain  (pain score 4-6). 02/20/24   Nicholaus Almarie HERO, MD  benzonatate  (TESSALON ) 100 MG capsule Take 1 capsule by mouth every 8 (eight) hours for cough. 05/21/24   Rolinda Rogue, MD  ibuprofen  (ADVIL ) 800 MG tablet Take 1 tablet (800 mg total) by mouth every 8 (eight) hours as needed for mild pain (pain score 1-3) or moderate pain (pain score 4-6). 02/20/24   Nicholaus Almarie HERO, MD  Prenatal Vit-Fe Fumarate-FA (PRENATAL PLUS VITAMIN/MINERAL) 27-1 MG TABS Take 1 tablet by mouth daily. 11/28/23   Davis, Devon E, PA-C  senna (SENOKOT) 8.6 MG TABS tablet Take 2 tablets (17.2 mg total) by mouth at bedtime as needed for mild constipation. 02/20/24   Nicholaus Almarie HERO, MD    Family History Family History  Problem Relation Age of Onset   Healthy Mother    Healthy Father     Social History Social History[1]   Allergies   Patient has no known allergies.   Review of Systems Review of Systems  Constitutional:  Negative for chills and fever.  HENT:  Positive for congestion and sore throat. Negative for ear pain.   Eyes:  Negative for  pain and visual disturbance.  Respiratory:  Negative for cough and shortness of breath.   Cardiovascular:  Negative for chest pain and palpitations.  Gastrointestinal:  Negative for abdominal pain and vomiting.  Genitourinary:  Negative for dysuria and hematuria.  Musculoskeletal:  Negative for arthralgias and back pain.  Skin:  Negative for color change and rash.  Neurological:  Negative for seizures and syncope.  All other systems reviewed and are negative.    Physical Exam Triage Vital Signs ED Triage Vitals  Encounter Vitals Group     BP 07/13/24 1601 118/77     Girls Systolic BP Percentile --      Girls Diastolic BP Percentile --      Boys Systolic BP Percentile --      Boys Diastolic BP Percentile --      Pulse Rate 07/13/24 1601 65     Resp 07/13/24 1601 16     Temp 07/13/24 1601 97.8 F (36.6 C)     Temp Source 07/13/24 1601 Oral     SpO2 07/13/24  1601 98 %     Weight --      Height --      Head Circumference --      Peak Flow --      Pain Score 07/13/24 1556 7     Pain Loc --      Pain Education --      Exclude from Growth Chart --    No data found.  Updated Vital Signs BP 118/77 (BP Location: Right Arm)   Pulse 65   Temp 97.8 F (36.6 C) (Oral)   Resp 16   LMP  (LMP Unknown)   SpO2 98%   Breastfeeding Yes   Visual Acuity Right Eye Distance:   Left Eye Distance:   Bilateral Distance:    Right Eye Near:   Left Eye Near:    Bilateral Near:     Physical Exam Vitals and nursing note reviewed.  Constitutional:      General: She is not in acute distress.    Appearance: She is well-developed.  HENT:     Head: Normocephalic and atraumatic.     Right Ear: Tympanic membrane normal.     Left Ear: Tympanic membrane normal.     Nose: Nasal tenderness, congestion and rhinorrhea present. Rhinorrhea is purulent.     Right Sinus: Maxillary sinus tenderness present.     Left Sinus: Maxillary sinus tenderness present.     Mouth/Throat:     Mouth: Mucous membranes are moist.     Pharynx: Posterior oropharyngeal erythema present.  Eyes:     Conjunctiva/sclera: Conjunctivae normal.  Cardiovascular:     Rate and Rhythm: Normal rate and regular rhythm.     Heart sounds: No murmur heard. Pulmonary:     Effort: Pulmonary effort is normal. No respiratory distress.     Breath sounds: Normal breath sounds.  Abdominal:     Palpations: Abdomen is soft.     Tenderness: There is no abdominal tenderness.  Musculoskeletal:        General: No swelling.     Cervical back: Neck supple.  Skin:    General: Skin is warm and dry.     Capillary Refill: Capillary refill takes less than 2 seconds.  Neurological:     Mental Status: She is alert.  Psychiatric:        Mood and Affect: Mood normal.      UC Treatments / Results  Labs (all labs  ordered are listed, but only abnormal results are displayed) Labs Reviewed  POCT RAPID  STREP A (OFFICE)    EKG   Radiology No results found.  Procedures Procedures (including critical care time)  Medications Ordered in UC Medications - No data to display  Initial Impression / Assessment and Plan / UC Course  I have reviewed the triage vital signs and the nursing notes.  Pertinent labs & imaging results that were available during my care of the patient were reviewed by me and considered in my medical decision making (see chart for details).     Acute non-recurrent maxillary sinusitis  Sore throat - Plan: POC rapid strep A, POC rapid strep A   Strep testing done today is negative.  Symptoms, physical exam findings and duration is consistent with a bacterial sinus infection. We will treat with the following:  Amoxicillin  875 mg twice daily for 7 days.  This is an antibiotic.  Take this with food.  Alternate Tylenol  and ibuprofen  for pain or fevers Make sure to stay hydrated by drinking plenty of water. Return to urgent care or PCP if symptoms worsen or fail to resolve.    Final Clinical Impressions(s) / UC Diagnoses   Final diagnoses:  Sore throat  Acute non-recurrent maxillary sinusitis     Discharge Instructions      Strep testing done today is negative.  Symptoms, physical exam findings and duration is consistent with a bacterial sinus infection. We will treat with the following:  Amoxicillin  875 mg twice daily for 7 days.  This is an antibiotic.  Take this with food.  Alternate Tylenol  and ibuprofen  for pain or fevers Make sure to stay hydrated by drinking plenty of water. Return to urgent care or PCP if symptoms worsen or fail to resolve.      ED Prescriptions     Medication Sig Dispense Auth. Provider   amoxicillin  (AMOXIL ) 875 MG tablet Take 1 tablet (875 mg total) by mouth 2 (two) times daily for 7 days. 14 tablet Teresa Almarie LABOR, PA-C      PDMP not reviewed this encounter.    [1]  Social History Tobacco Use   Smoking status:  Never   Smokeless tobacco: Never  Vaping Use   Vaping status: Never Used  Substance Use Topics   Alcohol use: Never   Drug use: Never     Teresa Almarie LABOR, PA-C 07/13/24 1708

## 2024-07-13 NOTE — Discharge Instructions (Addendum)
 Strep testing done today is negative.  Symptoms, physical exam findings and duration is consistent with a bacterial sinus infection. We will treat with the following:  Amoxicillin  875 mg twice daily for 7 days.  This is an antibiotic.  Take this with food.  Alternate Tylenol  and ibuprofen  for pain or fevers Make sure to stay hydrated by drinking plenty of water. Return to urgent care or PCP if symptoms worsen or fail to resolve.

## 2024-07-13 NOTE — ED Triage Notes (Signed)
 Patient has a nasal congestion and sore throat that stared Monday.  Patient denies any medication

## 2024-08-14 ENCOUNTER — Ambulatory Visit (HOSPITAL_COMMUNITY)
Admission: EM | Admit: 2024-08-14 | Discharge: 2024-08-14 | Disposition: A | Discharge status: Home / Self Care | Attending: Internal Medicine | Admitting: Internal Medicine

## 2024-08-14 ENCOUNTER — Encounter (HOSPITAL_COMMUNITY): Payer: Self-pay

## 2024-08-14 DIAGNOSIS — H10022 Other mucopurulent conjunctivitis, left eye: Secondary | ICD-10-CM

## 2024-08-14 MED ORDER — GENTAMICIN SULFATE 0.3 % OP SOLN: 2.0000 [drp] | OPHTHALMIC | 0 refills | Status: AC

## 2024-08-14 NOTE — ED Triage Notes (Addendum)
 Patient reports that she woke this AM with left eye crusting, red, watery, and with swelling around the eye since this AM.   Patient states she took Tylenol  for a headache last night.

## 2024-08-14 NOTE — ED Provider Notes (Signed)
 " [facility]    CSN: [id number] Arrival date & time: 08/14/24  1050      History   Chief Complaint Chief Complaint  Patient presents with   eye issue    HPI Sierra Thomas is a 34 y.o. female.   Sierra Thomas is a 35 y.o. female presenting for chief complaint of itching, swelling, and crusty/watery drainage from the left eye that started upon waking this morning.  She woke up and her eye was matted shut. She states vision is a little blurry out of the left eye. Right eye is asymptomatic.  Denies recent trauma/injuries to the left eye, foreign body to left eye, glasses/contact lens use, and recent sick contacts with similar symptoms.  She further denies nasal congestion, rhinorrhea, fever, cold chills, nausea, vomiting, body aches, cough, sore throat, and ear pain. She has not attempted treatment of symptoms at home.     Past Medical History:  Diagnosis Date   Anemia    Anemia 01/10/2019   CBC Latest Ref Rng & Units 06/21/2020 01/11/2019 01/10/2019 WBC 3.4 - 10.8 x10E3/uL 4.4 9.6 6.9 Hemoglobin 11.1 - 15.9 g/dL 87.9 10.7(L) 11.8(L) Hematocrit 34.0 - 46.6 % 34.5 32.7(L) 35.6(L) Platelets 150 - 450 x10E3/uL 150 228 220     Patient Active Problem List   Diagnosis Date Noted   Language barrier affecting health care 12/21/2023   Late prenatal care 11/28/2023   Supervision of high risk pregnancy, antepartum 11/28/2023    Past Surgical History:  Procedure Laterality Date   NO PAST SURGERIES      OB History     Gravida  4   Para  4   Term  4   Preterm  0   AB  0   Living  4      SAB  0   IAB  0   Ectopic  0   Multiple  0   Live Births  4            Home Medications    Prior to Admission medications  Medication Sig Start Date End Date Taking? Authorizing Provider  gentamicin  (GARAMYCIN ) 0.3 % ophthalmic solution Place 2 drops into the left eye every 4 (four) hours for 7 days. 08/14/24 08/21/24 Yes Valbona Slabach, Dorna HERO, FNP   acetaminophen  (TYLENOL ) 500 MG tablet Take 2 tablets (1,000 mg total) by mouth every 8 (eight) hours as needed for mild pain (pain score 1-3) or moderate pain (pain score 4-6). 02/20/24   Nicholaus Almarie HERO, MD  benzonatate  (TESSALON ) 100 MG capsule Take 1 capsule by mouth every 8 (eight) hours for cough. Patient not taking: Reported on 08/14/2024 05/21/24   Rolinda Rogue, MD  ibuprofen  (ADVIL ) 800 MG tablet Take 1 tablet (800 mg total) by mouth every 8 (eight) hours as needed for mild pain (pain score 1-3) or moderate pain (pain score 4-6). Patient not taking: Reported on 08/14/2024 02/20/24   Nicholaus Almarie HERO, MD  Prenatal Vit-Fe Fumarate-FA (PRENATAL PLUS VITAMIN/MINERAL) 27-1 MG TABS Take 1 tablet by mouth daily. Patient not taking: Reported on 08/14/2024 11/28/23   Davis, Devon E, PA-C  senna (SENOKOT) 8.6 MG TABS tablet Take 2 tablets (17.2 mg total) by mouth at bedtime as needed for mild constipation. Patient not taking: Reported on 08/14/2024 02/20/24   Nicholaus Almarie HERO, MD    Family History Family History  Problem Relation Age of Onset   Healthy Mother    Healthy Father     Social History Social History[1]  Allergies   Patient has no known allergies.   Review of Systems Review of Systems Per HPI  Physical Exam Triage Vital Signs ED Triage Vitals  Encounter Vitals Group     BP 08/14/24 1136 128/73     Girls Systolic BP Percentile --      Girls Diastolic BP Percentile --      Boys Systolic BP Percentile --      Boys Diastolic BP Percentile --      Pulse Rate 08/14/24 1136 72     Resp 08/14/24 1136 14     Temp 08/14/24 1136 98.3 F (36.8 C)     Temp Source 08/14/24 1136 Oral     SpO2 08/14/24 1136 97 %     Weight --      Height --      Head Circumference --      Peak Flow --      Pain Score 08/14/24 1133 8     Pain Loc --      Pain Education --      Exclude from Growth Chart --    No data found.  Updated Vital Signs BP 128/73 (BP Location: Right Arm)    Pulse 72   Temp 98.3 F (36.8 C) (Oral)   Resp 14   SpO2 97%   Breastfeeding Yes   Visual Acuity Right Eye Distance:   Left Eye Distance:   Bilateral Distance:    Right Eye Near:   Left Eye Near:    Bilateral Near:     Physical Exam Vitals and nursing note reviewed.  Constitutional:      Appearance: She is not ill-appearing or toxic-appearing.  HENT:     Head: Normocephalic and atraumatic.     Right Ear: Hearing and external ear normal.     Left Ear: Hearing and external ear normal.     Nose: Nose normal.     Mouth/Throat:     Lips: Pink.  Eyes:     General: Lids are normal. Lids are everted, no foreign bodies appreciated. Vision grossly intact. Gaze aligned appropriately.        Left eye: Discharge (Watery/crusty purulent mucus drainage from the left eye) present.    Extraocular Movements: Extraocular movements intact.     Conjunctiva/sclera:     Right eye: Right conjunctiva is not injected. No chemosis, exudate or hemorrhage.    Left eye: Left conjunctiva is injected. No chemosis, exudate or hemorrhage.    Pupils: Pupils are equal, round, and reactive to light.     Visual Fields: Right eye visual fields normal and left eye visual fields normal.     Comments: EOMs intact without pain or dizziness elicited.  Pulmonary:     Effort: Pulmonary effort is normal.  Musculoskeletal:     Cervical back: Neck supple.  Skin:    General: Skin is warm and dry.     Capillary Refill: Capillary refill takes less than 2 seconds.     Findings: No rash.  Neurological:     General: No focal deficit present.     Mental Status: She is alert and oriented to person, place, and time. Mental status is at baseline.     Cranial Nerves: No dysarthria or facial asymmetry.  Psychiatric:        Mood and Affect: Mood normal.        Speech: Speech normal.        Behavior: Behavior normal.        Thought  Content: Thought content normal.        Judgment: Judgment normal.      UC Treatments /  Results  Labs (all labs ordered are listed, but only abnormal results are displayed) Labs Reviewed - No data to display  EKG   Radiology No results found.  Procedures Procedures (including critical care time)  Medications Ordered in UC Medications - No data to display  Initial Impression / Assessment and Plan / UC Course  I have reviewed the triage vital signs and the nursing notes.  Pertinent labs & imaging results that were available during my care of the patient were reviewed by me and considered in my medical decision making (see chart for details).   1.  Mucopurulent conjunctivitis of left eye Presentation consistent with acute bacterial conjunctivitis.  HEENT exam stable, low suspicion for ocular emergency.  Ophthalmic medication as prescribed for the next 7 days.  Warm compress recommended frequently.  Over the counter medications as needed for aches/pains. Hand hygiene discussed to prevent spread of infection to others.  Advised to change pillowcase after 2 to 3 days of antibiotics to avoid reinfection.   Counseled patient on potential for adverse effects with medications prescribed/recommended today, strict ER and return-to-clinic precautions discussed, patient verbalized understanding.    Final Clinical Impressions(s) / UC Diagnoses   Final diagnoses:  Mucopurulent conjunctivitis of left eye     Discharge Instructions      You have bacterial conjunctivitis (pink eye) which is an eye infection.    - Use antibiotic eye medication sent to pharmacy as directed.  - Change your pillowcase after 2 to 3 days to avoid reinfection.  - You may take Tylenol  every 6 hours as needed for any pain you may have.  - Avoid scratching your eye.  Wash your hands frequently to avoid spread of infection to others.  Perform warm compresses to your eye before applying the eye medication.  If you develop any new or worsening symptoms or do not improve in the next 2 to 3 days,  please return.  If your symptoms are severe, please go to the emergency room.  Follow-up with your primary care provider for further evaluation and management of your symptoms as well as ongoing wellness visits.  I hope you feel better!      ED Prescriptions     Medication Sig Dispense Auth. Provider   gentamicin  (GARAMYCIN ) 0.3 % ophthalmic solution Place 2 drops into the left eye every 4 (four) hours for 7 days. 5 mL Enedelia Dorna HERO, FNP      PDMP not reviewed this encounter.     [1]  Social History Tobacco Use   Smoking status: Never   Smokeless tobacco: Never  Vaping Use   Vaping status: Never Used  Substance Use Topics   Alcohol use: Never   Drug use: Never     Enedelia Dorna HERO, FNP 08/14/24 1258  "

## 2024-08-14 NOTE — Discharge Instructions (Signed)
You have bacterial conjunctivitis (pink eye) which is an eye infection.    - Use antibiotic eye medication sent to pharmacy as directed.  - Change your pillowcase after 2 to 3 days to avoid reinfection.  - You may take Tylenol every 6 hours as needed for any pain you may have.  - Avoid scratching your eye.  Wash your hands frequently to avoid spread of infection to others.  Perform warm compresses to your eye before applying the eye medication.  If you develop any new or worsening symptoms or do not improve in the next 2 to 3 days, please return.  If your symptoms are severe, please go to the emergency room.  Follow-up with your primary care provider for further evaluation and management of your symptoms as well as ongoing wellness visits.  I hope you feel better!
